# Patient Record
Sex: Male | Born: 1950
Health system: Southern US, Community
[De-identification: ages and names within clinical notes are randomized; demographics above are authoritative.]

## PROBLEM LIST (undated history)

## (undated) DIAGNOSIS — Z8679 Personal history of other diseases of the circulatory system: Secondary | ICD-10-CM

## (undated) DIAGNOSIS — R011 Cardiac murmur, unspecified: Secondary | ICD-10-CM

## (undated) DIAGNOSIS — I2510 Atherosclerotic heart disease of native coronary artery without angina pectoris: Secondary | ICD-10-CM

## (undated) DIAGNOSIS — G4700 Insomnia, unspecified: Secondary | ICD-10-CM

## (undated) DIAGNOSIS — K219 Gastro-esophageal reflux disease without esophagitis: Secondary | ICD-10-CM

## (undated) DIAGNOSIS — E785 Hyperlipidemia, unspecified: Secondary | ICD-10-CM

## (undated) DIAGNOSIS — D649 Anemia, unspecified: Secondary | ICD-10-CM

## (undated) DIAGNOSIS — Z973 Presence of spectacles and contact lenses: Secondary | ICD-10-CM

## (undated) DIAGNOSIS — Z87442 Personal history of urinary calculi: Secondary | ICD-10-CM

## (undated) DIAGNOSIS — C61 Malignant neoplasm of prostate: Secondary | ICD-10-CM

## (undated) DIAGNOSIS — M069 Rheumatoid arthritis, unspecified: Secondary | ICD-10-CM

## (undated) DIAGNOSIS — I10 Essential (primary) hypertension: Secondary | ICD-10-CM

## (undated) DIAGNOSIS — E039 Hypothyroidism, unspecified: Secondary | ICD-10-CM

## (undated) DIAGNOSIS — R399 Unspecified symptoms and signs involving the genitourinary system: Secondary | ICD-10-CM

## (undated) DIAGNOSIS — G7000 Myasthenia gravis without (acute) exacerbation: Secondary | ICD-10-CM

## (undated) DIAGNOSIS — N200 Calculus of kidney: Secondary | ICD-10-CM

## (undated) DIAGNOSIS — E349 Endocrine disorder, unspecified: Secondary | ICD-10-CM

## (undated) DIAGNOSIS — I519 Heart disease, unspecified: Secondary | ICD-10-CM

## (undated) HISTORY — DX: Cardiac murmur, unspecified: R01.1

## (undated) HISTORY — DX: Essential (primary) hypertension: I10

## (undated) HISTORY — DX: Endocrine disorder, unspecified: E34.9

## (undated) HISTORY — DX: Hyperlipidemia, unspecified: E78.5

## (undated) HISTORY — DX: Myasthenia gravis without (acute) exacerbation: G70.00

## (undated) HISTORY — DX: Atherosclerotic heart disease of native coronary artery without angina pectoris: I25.10

## (undated) HISTORY — PX: EXTRACORPOREAL SHOCK WAVE LITHOTRIPSY: SHX1557

## (undated) HISTORY — DX: Calculus of kidney: N20.0

## (undated) HISTORY — PX: TONSILLECTOMY: SUR1361

## (undated) HISTORY — DX: Insomnia, unspecified: G47.00

## (undated) HISTORY — DX: Heart disease, unspecified: I51.9

## (undated) HISTORY — PX: COLONOSCOPY: SHX174

---
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
## 2004-07-29 ENCOUNTER — Inpatient Hospital Stay (HOSPITAL_COMMUNITY): Admission: AD | Admit: 2004-07-29 | Discharge: 2004-08-05 | Payer: Self-pay | Admitting: Cardiology

## 2004-07-29 ENCOUNTER — Ambulatory Visit: Payer: Self-pay | Admitting: Cardiology

## 2004-07-30 ENCOUNTER — Encounter: Payer: Self-pay | Admitting: Cardiology

## 2004-07-30 ENCOUNTER — Ambulatory Visit: Payer: Self-pay | Admitting: Cardiology

## 2004-07-30 HISTORY — PX: CARDIAC CATHETERIZATION: SHX172

## 2004-08-03 HISTORY — PX: CORONARY ANGIOPLASTY: SHX604

## 2004-08-19 ENCOUNTER — Ambulatory Visit: Payer: Self-pay | Admitting: Cardiology

## 2004-08-27 ENCOUNTER — Ambulatory Visit: Payer: Self-pay | Admitting: Cardiology

## 2005-08-02 ENCOUNTER — Ambulatory Visit: Payer: Self-pay | Admitting: Cardiology

## 2005-08-05 ENCOUNTER — Ambulatory Visit: Payer: Self-pay | Admitting: Cardiology

## 2006-04-07 ENCOUNTER — Ambulatory Visit: Payer: Self-pay | Admitting: Cardiology

## 2007-11-07 ENCOUNTER — Ambulatory Visit: Payer: Self-pay | Admitting: Cardiology

## 2007-11-14 ENCOUNTER — Ambulatory Visit: Payer: Self-pay | Admitting: Cardiology

## 2007-12-19 ENCOUNTER — Encounter: Payer: Self-pay | Admitting: Cardiology

## 2007-12-19 ENCOUNTER — Ambulatory Visit: Payer: Self-pay | Admitting: Cardiology

## 2007-12-19 DIAGNOSIS — I10 Essential (primary) hypertension: Secondary | ICD-10-CM | POA: Insufficient documentation

## 2007-12-19 DIAGNOSIS — E785 Hyperlipidemia, unspecified: Secondary | ICD-10-CM | POA: Insufficient documentation

## 2007-12-19 DIAGNOSIS — G7000 Myasthenia gravis without (acute) exacerbation: Secondary | ICD-10-CM | POA: Insufficient documentation

## 2007-12-19 DIAGNOSIS — I2510 Atherosclerotic heart disease of native coronary artery without angina pectoris: Secondary | ICD-10-CM | POA: Insufficient documentation

## 2007-12-19 DIAGNOSIS — G4700 Insomnia, unspecified: Secondary | ICD-10-CM | POA: Insufficient documentation

## 2007-12-20 ENCOUNTER — Encounter: Payer: Self-pay | Admitting: Cardiology

## 2008-04-19 ENCOUNTER — Encounter: Payer: Self-pay | Admitting: Cardiology

## 2008-06-25 ENCOUNTER — Encounter: Payer: Self-pay | Admitting: Cardiology

## 2008-07-05 ENCOUNTER — Encounter: Payer: Self-pay | Admitting: Cardiology

## 2008-07-26 ENCOUNTER — Encounter: Payer: Self-pay | Admitting: Physician Assistant

## 2008-08-16 ENCOUNTER — Ambulatory Visit: Payer: Self-pay | Admitting: Cardiology

## 2008-08-16 ENCOUNTER — Encounter: Payer: Self-pay | Admitting: Cardiology

## 2008-08-16 DIAGNOSIS — IMO0001 Reserved for inherently not codable concepts without codable children: Secondary | ICD-10-CM | POA: Insufficient documentation

## 2008-08-16 DIAGNOSIS — Z87448 Personal history of other diseases of urinary system: Secondary | ICD-10-CM | POA: Insufficient documentation

## 2008-08-16 DIAGNOSIS — E039 Hypothyroidism, unspecified: Secondary | ICD-10-CM | POA: Insufficient documentation

## 2008-09-02 ENCOUNTER — Encounter (INDEPENDENT_AMBULATORY_CARE_PROVIDER_SITE_OTHER): Payer: Self-pay | Admitting: *Deleted

## 2010-06-16 NOTE — Assessment & Plan Note (Signed)
[facility]                          [facility] OFFICE NOTE   NAME:Arthur Hart, Arthur Hart                        MRN:          [id number]  DATE:11/07/2007                            DOB:          05-27-1950    PRIMARY CARE PHYSICIAN:  Dr. Sherryll Burger.   REASON FOR VISIT:  One-year follow-up.   HISTORY OF PRESENT ILLNESS:  Arthur Hart is a 59 year old male patient  with a history of coronary disease status post cutting balloon  angioplasty to the LAD in July 2006 who presents to the office today for  annual follow-up.  Arthur Hart was noted to have significant spasm in the  LAD at the time of his catheterization in 2006.  He actually remained on  intracoronary nitroglycerin prior to his intervention.  His intervention  was done in the LAD at the takeoff of the diagonal where a 70-80% lesion  was demonstrated.  His residual CAD at that time included 30% ostial RCA  lesion which was felt to be suspicious for some spasm.  The diagonal  vessel had a 40-50% narrowing as well.  His EF was well-preserved.  His  last stress test was done in July 2007.  This was negative for ischemia,  and his EF was 60%.  The patient had been placed on nitrates at the time  of intervention and has been maintained on nitrate since that time.  The  patient has noted chest pain off and on since his intervention.   Over the last several months, he thinks that his chest discomfort is  somewhat more prominent.  It is definitely worse with over exertion.  He  ran out of his isosorbide recently.  He decided not to refill it and did  note that the symptoms got worse.  He restarted the medication and noted  that his symptoms did get better.  It should be noted that he does have  erectile dysfunction, and he has taken a phosphodiesterase inhibitor in  the past while he was taking nitrate therapy.  The patient's symptoms  include a left-sided ache.  He does note shortness of breath from time  to time  as well as left arm pain.  He denies nausea or diaphoresis or  syncope.  He denies orthopnea, PND or pedal edema.  He denies any cough.  He did have some recent unexplained fevers treated with several rounds  of antibiotics by his primary care physician.  This seems to have  resolved.   The patient does note a recent history of headaches.  He has never had  headaches like this in the past.  It is unilateral on the left side in  the frontal region.  He does have some blurry vision associated with  this.  He denies photophobia.  He denies associated nausea.  He denies  any symptoms consistent with amaurosis fugax.  He denies any true  scotoma.   CURRENT MEDICATIONS:  Lipitor 10 mg daily, Aspirin 325 mg daily,  Isosorbide 60 mg daily, Synthroid 0.17 mg daily, Temazepam 30 mg every  night, Lisinopril hydrochlorothiazide 10/12.5 mg daily,  Xanax p.r.n., Nitroglycerin p.r.n.   PHYSICAL EXAMINATION:  He is well nourished, well developed in no  distress.  Blood pressure is 127/75, pulse 57, weight 181.6 pounds.  HEENT:  Normal.  NECK:  Without JVD.  LYMPH:  Without lymphadenopathy.  CARDIAC:  Normal S1-S2.  Rate and rhythm without murmur.  LUNGS:  Clear to auscultation bilaterally.  ABDOMEN:  Soft, nontender.  EXTREMITIES:  Without edema.  NEUROLOGIC:  He is alert and oriented x3.  Cranial II-XII grossly  intact.  VASCULAR EXAM:  Without carotid bruits bilaterally.   Electrocardiogram reveals sinus bradycardia with a rate of 55, normal  axis, no acute changes, incomplete right bundle branch block.   ASSESSMENT/PLAN:  1. Chest pain in a 60 year old male patient with a history of coronary      disease status post cutting balloon angioplasty to the LAD in 2006      and residual CAD as outlined above and overall preserved LV      function with a nonischemic Cardiolite study in 2007.  His symptoms      are similar to his previous angina.  He did have significant spasm      on his original  heart catheterization.  He has been maintained on      nitrates since that time.  I am somewhat concerned that he has      taken phosphodiesterase inhibitors while he is on nitrate therapy      in the past.  We had a long discussion today regarding adjustments      in his therapy.  At this point, I have recommended that we      discontinue his isosorbide and replace it with amlodipine at 5 mg a      day.  It has been a few years since his last stress test.  I think      we should proceed with a Cardiolite to rule out progression of      coronary disease.  He will undergo graded exercise treadmill      Cardiolite testing in the next several days.  He will continue on      aspirin.  It should be noted that beta-blockers have not been used      in the past secondary to a history of myasthenia gravis.  2. Headaches.  The patient's headaches sound consistent with migraine      headaches.  Given his visual disturbance, I question whether or not      this could be related to his history of myasthenia gravis.  I think      he should follow up with his primary care physician to further      assess this.  I have asked him to arrange an appointment with Dr.      Sherryll Burger to discuss his headaches further.  Discontinuation of      isosorbide may help alleviate some of his headaches.  3. Dyslipidemia.  He will continue on Lipitor.  Will follow up with      lipids and LFTs.  4. Hypothyroidism.  He will continue follow-up with Dr. Sherryll Burger.  5. History of myasthenia gravis.  He has been stable with this ever      since he had his thymectomy.  Again, as outlined above, I have      asked him follow up Dr. Sherryll Burger.   DISPOSITION:  The patient was also examined by Dr. Andee Lineman today.  He  will follow up with Dr. Andee Lineman in  the next 4-6 weeks to review the above  testing or sooner p.r.n.  I warned the patient today about taking  nitrates with phosphodiesterase inhibitors.      Tereso Newcomer, PA-C  Electronically  Signed      Learta Codding, MD,FACC  Electronically Signed   SW/MedQ  DD: 11/07/2007  DT: 11/07/2007  Job #: [id number]   cc:   Sherryll Burger, M.D.

## 2010-06-19 NOTE — Cardiovascular Report (Signed)
Arthur Hart, Arthur Hart NO.:  [id number]   MEDICAL RECORD NO.:  [id number]          PATIENT TYPE:  INP   LOCATION:  2024                         FACILITY:  [facility]   PHYSICIAN:  Arturo Morton. Riley Kill, M.D. [facility] OF BIRTH:  1950/11/21   DATE OF PROCEDURE:  DATE OF DISCHARGE:                              CARDIAC CATHETERIZATION   INDICATIONS:  Arthur Hart is a very delightful 60 year old who presented  recently with what was felt to be an acute coronary syndrome. He underwent  catheterization which demonstrated some plaquing in the proximal LAD with  some spasm and a hypodense lesion in the mid LAD at the takeoff of the major  diagonal. The diagonal itself had about a 50-60% ostial stenosis. The  diagonal was quite large. The patient also has a history of myasthenia with  prior thymectomy and has hypothyroidism on thyroid replacement therapy. We  took him off the table and got a neurology consult.  They saw him in  consultation and made some specific recommendations that did not require  treatment; however, we felt that we needed to be careful with contrast. We  talked about various options at this point in time and the decision was made  to bring him back to the catheterization laboratory for relook and possible  intravascular ultrasound and potential treatment. We discussed all the  potential approaches and risks and benefits with the patient and his family  and they were agreeable to proceed.   PROCEDURES:  1.  Selective coronary arteriography.  2.  Percutaneous intravascular ultrasound.  3.  Cutting balloon angioplasty of the left anterior descending artery.   DESCRIPTION OF PROCEDURE:  The patient was brought to the catheterization  lab and prepped and draped in usual fashion through an anterior puncture of  the left femoral artery was entered. There was slight oozing at the site of  the sheath insertion. Diagnostic coronary arteriography was performed. I  asked  Dr. Juanda Chance to come to the laboratory and review the films. It was our  consensus that intravascular ultrasound should be performed.  Intravascular  ultrasound revealed a fairly tight lesion with a lesion at the bifurcation.  The distal vessel was approximately 3 x 3 and the lesion site reference  vessel diameter was about 3.5 x 3.5.  The residual lumen was 1.8 x 1.6 with  an area of 2.3. Based on this, we elected to go ahead and treat. The exact  option and best approach was unclear.  We gave the patient Bivalirudin and  an ACT was checked nd found to be in excess of 300 seconds. The wire was  left down the LAD and a second wire was placed across the diagonal. I  elected to go with a 2.75 cutting balloon, 10 mm in length, to assess the  response of the diagonal. Multiple cutting balloon inflations were done at  the site of the lesion with inflation up to approximately 6 atmospheres.  There was substantial improvement in appearance of the LAD  with only mild  compromise of the diagonal. It was felt that if we stented  across this there  would probably be potentially significant compromise and that we would end  up treating this and the diagonal is nearly as big as the LAD. Based upon  the marked improvement in the appearance of the artery, I elected to keep  the patient on the table for additional 10 minutes and take additional  views. With the additional 10 minutes the LAD looked as good as it did with  the original treatment, and I elected to stop at this point in time. The LAD  improved from about 80% down to about 20 to at most 30% and the diagonal  went from approximately 50 to perhaps 60-70 but did not appear to be tight  and there appeared to be good flow down the vessel. Based on this, it was  felt that we should not proceed on and Dr. Samule Ohm and I looked at the films  and were in agreement that further percutaneous intervention should not be  performed based upon the angiographic  result at the time. I then reviewed  the films to family in detail.   All catheters were subsequently removed, femoral sheath sewn into place, and  the patient taken to the holding area in satisfactory clinical condition.   1.  Intravascular ultrasound demonstrated a fairly smooth-appearing distal      left anterior descending artery with about 3.3 x 3  to slightly larger      than this in terms of reference vessel at the site, as previously noted.      There was an approximate 9.5 mm length of disease at the worst site      where the takeoff of the LAD, 1.8 x 1.6 lumen with a luminal area of 2.3      and a  reference area 10.2 yielding a stenosis of 77.4%. Proximal to      this, the vessel opens up quite a bit and there is a fairly prominent      amount of proximal plaquing in the left anterior descending artery as      previously noted. At the worst site this looks about 2.2 x 2.3.  As      noted on previous angiographic study this area appears to open up rather      substantially with intracoronary nitroglycerin.   ANGIOGRAPHIC DATA:  The left anterior descending artery demonstrates about  30-40% narrowing in the proximal vessel. At the takeoff of the second  diagonal there is a focal stenosis in the LAD that measures about 70 to  nearly 80% luminal reduction and is hypodense in multiple views. This  extends over about 10-12 mm area. The diagonal itself has about 40 50%  narrowing. Following treatment, the 80% stenosis reduced to about 20% and  the diagonal was perhaps slightly worse but not significant in terms of flow  limitation. Final angiographic result was felt to be acceptable.   CONCLUSION:  Successful percutaneous cutting balloon angioplasty of the left  anterior descending artery.   DISPOSITION:  The patient be followed closely. Importantly, treatment with  stenting will probably require dual stenting of both the diagonal and LAD with one of a variety of techniques which  could include crush or perhaps V  stenting. Given the limitations of these approaches, it was felt that  initial treatment with a cutting balloon and a good residual lumen on both  branches would be a fairly optimal initial approach, particularly in light  of the patient's myasthenia. We will follow  closely.       TDS/MEDQ  D:  08/03/2004  T:  08/03/2004  Job:  [id number]   cc:   Jonelle Sidle, M.D. [facility]   Kirstie Peri, MD  [address]  [place] [address]  Fax: [phone]

## 2010-06-19 NOTE — Consult Note (Signed)
NAMEBLESSED, COTHAM NO.:  [id number]   MEDICAL RECORD NO.:  [id number]          PATIENT TYPE:  INP   LOCATION:  6532                         FACILITY:  [facility]   PHYSICIAN:  Deanna Artis. Hickling, M.D.DATE OF BIRTH:  Jul 29, 1950   DATE OF CONSULTATION:  07/30/2004  DATE OF DISCHARGE:                                   CONSULTATION   CHIEF COMPLAINT:  Myasthenia gravis.   HISTORY OF PRESENT CONDITION:  Arthur Hart is a 60 year old right-handed  Caucasian gentleman with coronary artery disease who was admitted in  transfer from [facility] for evaluation of four to five episodes of  anginal discomfort over three to four days. The episodes were sudden and  associated with substernal pain radiating to his neck, jaw, and elbows. He  had shortness of breath without diaphoresis. The episodes occurred both at  rest and with exertion. The patient had cardiac catheterization which showed  vasospasm proximally in the left coronary artery, 60% to 70%, which went  down to 30%. There was also some mild narrowing in the 50% range distal to  the left coronary artery and only about 30% in the right. Overall, this was  a fairly unremarkable angiogram.   Nonetheless as part of his evaluation plans were made to perform a 2-D  echocardiogram and to consider further diagnostic workup if that is  abnormal.   PAST MEDICAL HISTORY:  Hypertension, mild dyslipidemia, and myasthenia  gravis. The patient had onset of symptoms for myasthenia when he was 14 or  15 and had diagnosis made and thymectomy at age 55 in 68.  The patient was  on prednisone and Mestinon and did not like the feeling on those  medications. After the thymectomy he was able to be tapered off and has not  gone back on the medications since then despite having some eyelid ptosis,  rare problems with swallowing, rare double vision, but no true actual  weakness. Past medical history is also remarkable for rheumatic  fever as a  child with a chronic heart murmur, osteoarthritis, hypothyroidism, status  post thyroidectomy. I do not know whether he had Graves disease, goiter, or  exactly what his condition was.   PAST SURGICAL HISTORY:  Thymectomy, tonsillectomy, thyroidectomy, history of  nephrolithiasis.   CURRENT MEDICATIONS:  1.  Protonix 40 mg daily.  2.  Synthroid 125 mcg daily, increased to 175 mcg per day.  3.  Xanax 0.5 mg b.i.d.  4.  Lisinopril 5 mg daily.  5.  Enteric-coated aspirin 325 mg daily.   The patient has had other p.r.n. medications including morphine sulfate.   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 The patient does not use tobacco. Though he used alcohol in  the past, just not now.   FAMILY HISTORY:  Both parents had coronary artery disease. The patient has  three brothers who had coronary artery disease.   REVIEW OF SYSTEMS:  The patient has normal appetite and generally sleeps  fairly well. He has some problems with snoring and wife says occasional  apnea. She cannot quantify this. He has not had significant weight gain  recently. HEENT/NECK: No otitis media, pharyngitis, sinusitis. PULMONARY: No  asthma, bronchitis, or pneumonia. CARDIOVASCULAR: See above.  GASTROINTESTINAL: No nausea, vomiting, diarrhea, constipation.  GENITOURINARY: No urinary tract infection or hematuria. MUSCULOSKELETAL: No  fractures, sprains, or deformities. Osteoarthritis is present. ENDOCRINE:  See above. No diabetes. HEMATOLOGIC: No anemia, bruisability, or  lymphadenopathy. ALLERGY/IMMUNOLOGY: No known environmental allergies.  NEUROPSYCHIATRIC: No depression or anxiety.  NEUROLOGIC: No diplopia,  dysarthria, dysphagia, tinnitus, syncope, vertigo, weakness, numbness,  tingling, or loss of bowel and bladder control. Total system review is  otherwise negative.   PHYSICAL EXAMINATION:  GENERAL: On examination today this is a well-   developed, well-nourished right-handed gentleman, balding, in no acute  distress.  VITAL SIGNS: Temperature 98.1, blood pressure 120/75, resting pulse 59,  respirations 16, oxygen saturation 97%.  HEENT: No signs of infection.  LUNGS: Clear to auscultation.  HEART: No murmurs. Pulses normal.  ABDOMEN: Soft,  nontender. Bowel sounds normal. No hepatosplenomegaly.  EXTREMITIES: Well formed.  NEUROLOGIC: The patient is awake, alert, attentive, and appropriate. Cranial  nerves reveal round reactive pupils. Visual fields full. Extraocular  movements full and conjugate. He has some horizontal nystagmus and lateral  gaze which is no consequence. He has bilateral eyelid ptosis about 2-3 mm  above his pupils. This worsens with superior gaze and also worsens with eye  blinking 100 times. He has symmetric facial strength. He does not have a  sneer. He is able to protrude his and elevate his uvula midline. Motor exam  reveals normal strength, tone, and mass. Good fine motor movements. No  pronator drift. On sensation exam, I did not test the right leg vigorously  because of his catheterization. Sensation is intact to cold, vibration, and  stereognosis. Cerebellar examination reveals good finger-to-nose, repetitive  movements. Gait is normal. He is able to walk on his heels and toes,  performing tandem without difficulty. Reflexes are symmetric to diminished.  The patient has bilateral flexor plantar responses.   IMPRESSION:  1.  Myasthenia gravis which seems to have changed from generalized to      ocular.  2.  Obstructive sleep apnea. I do not think this is related to his      myasthenia.  3.  Atherosclerotic cardiovascular disease. This is not related to either of      his nervous system problems.   PLAN:  1.  We will perform a Tensilon test to see whether there is any other      weakness that we can uncover. 2.  For sleep apnea we can perform a polysomnogram. The patient has to be       willing to come for an overnight study. We can determine how often he      has sleep apnea and whether or not it is causing significant      interruption of his sleep.   Other than these two tests (Tensilon and polysomnogram) I see no further  workup necessary. I have given the patient my card and will follow up yearly  with him.   I appreciate the opportunity to participate in his care.       [facility]/MEDQ  D:  07/30/2004  T:  07/30/2004  Job:  [id number]   cc:   Arturo Morton. Riley Kill, M.D. [facility]

## 2010-06-19 NOTE — Op Note (Signed)
[name], Arthur Hart NO.:  [id number]   MEDICAL RECORD NO.:  [id number]          PATIENT TYPE:  INP   LOCATION:  6532                         FACILITY:  [facility]   PHYSICIAN:  Deanna Artis. Hickling, M.D.DATE OF BIRTH:  03/12/1950   DATE OF PROCEDURE:  07/30/2004  DATE OF DISCHARGE:                                 OPERATIVE REPORT   PROCEDURE:  Tensilon test.   INDICATIONS:  Myasthenia gravis with eyelid ptosis.   PROCEDURE:  Tensilon 1 mg was placed intravenously and flushed in the  patient's catheter.  This did not produce any systemic effects and no change  in his eyelids.  Then 9 mg was flushed and produced improvement in his  eyelid ptosis from 2-3 mm above the pupil at rest to 4-5 mm nearly bordering  on resting near the level of the superior margin of the iris.  The patient  had some tearing, some eyelid fasciculations, and some queasiness.  No  significant lowering of heart rate or blood pressure.   IMPRESSION:  Mildly positive Tensilon test, consistent with ocular  myasthenia.  Patient tolerated the procedure well.  His symptoms resolved  within minutes.       [facility]/MEDQ  D:  07/30/2004  T:  07/30/2004  Job:  [id number]

## 2010-06-19 NOTE — Assessment & Plan Note (Signed)
[facility]                          [facility] OFFICE NOTE   NAME:Arthur Hart, Arthur Hart                        MRN:          [id number]  DATE:04/07/2006                            DOB:          July 09, 1950    REASON FOR PRESENTATION:  The patient is a 60 year old, history of  coronary artery disease status post PCI with cutting balloon to the LAD  in July of 2006.  The patient also has 60% ostial diagonal stenosis, but  otherwise nonobstructive disease.  He has normal left ventricle  function.   The patient states he has been doing well.  He was seen last June in the  office, at which time he had still some chest pain.  He had a stress  test done which showed no ischemia.  He states now that he has no  recurrence of sternal chest pain, no shortness of breath, orthopnea,  PND.  He states he feels well.   CURRENT MEDICATIONS:  1. Plavix 75 mg a day.  2. Lipitor 10 mg p.o. q.h.s.  3. Aspirin 325 daily.  4. Isosorbide 60 mg p.o. daily.  5. Lisinopril 5 mg p.o. daily.  6. Synthroid 175 mcg p.o. daily.  7. Temazepam q.h.s.   PHYSICAL EXAMINATION:  VITAL SIGNS:  Blood pressure 168/92, heart rate  is 61 BPM.  GENERAL:  Well-nourished white male in no apparent distress.  HEENT:  Pupils are normal, conjunctiva pink.  NECK:  Supple, noJVD , no current bruits.  LUNGS:  Clear breath sounds bilaterally.  HEART:  Regular rate and rhythm, normal S1, S2, no murmurs, rubs or  gallops.  ABDOMEN:  Soft.  EXTREMITIES:  No cyanosis, clubbing or edema.   PROBLEM LIST:  1. Coronary artery disease status post PCI to the LAD.  2. Normal left ventricular function.  3. Hypertension, poorly controlled.  4. Hypothyroidism, status post thyroidectomy.  5. Dysphagia.  6. Dyslipidemia.   PLAN:  1. The patient is doing well from a cardiovascular perspective.  2. The patient has under-controlled hypertension.  We changed his      lisinopril to lisinopril 10      mg p.o.  daily/hydrochlorothiazide 12.5 mg p.o. daily.  3. The patient will follow up with [place] in one year.     Learta Codding, MD,FACC  Electronically Signed    GED/MedQ  DD: 04/07/2006  DT: 04/07/2006  Job #: [id number]

## 2010-06-19 NOTE — Cardiovascular Report (Signed)
NAMEDENNISE, BAMBER NO.:  [id number]   MEDICAL RECORD NO.:  [id number]          PATIENT TYPE:  INP   LOCATION:  6532                         FACILITY:  [facility]   PHYSICIAN:  Arturo Morton. Riley Kill, M.D. [facility] OF BIRTH:  1951/01/01   DATE OF PROCEDURE:  07/30/2004  DATE OF DISCHARGE:                              CARDIAC CATHETERIZATION   INDICATIONS:  Mr. Rueda is a 60 year old who presents with some recurrent  episodes of chest pain.  He has not had demonstrated EKG changes.  He has a  history of myasthenia gravis.  In addition to his myasthenia, the patient  has had hypothyroidism.  He has had prior thymectomy.  The current study was  done to assess coronary anatomy.   PROCEDURE:  1.  Left heart catheterization.  2.  Selective coronary arteriography.  3.  Selective left ventriculography.   DESCRIPTION OF THE PROCEDURE:  The patient was brought to the cath lab and  prepped and draped in the usual fashion.  Through an anterior puncture, the  right femoral artery was easily entered.  A 6-French sheath was placed.  Views of the left and right coronary arteries were obtained in multiple  angiographic projections.  Importantly, the patient had evidence of what  appeared to be coronary spasm in the proximal left anterior descending  artery.  This was dramatically improved with intracoronary nitroglycerin.  The patient also appears to have some bifurcational disease of the LAD  diagonal, but it was somewhat hard to assess in this setting.  Central  aortic and left ventricular pressures were done with a pigtail.  Ventriculography was performed in the RAO projection.  The cines were then  reviewed.  An acute MI was called for the emergency room.  We discussed the  various possibilities.  The patient was taken off and taken to the holding  area in satisfactory clinical condition.   HEMODYNAMIC DATA.:  1.  Central aortic pressure 150/83, mean 109.  2.  Left atrial  pressure 143/16.  3.  No gradient on pullback across aortic valve.   ANGIOGRAPHIC DATA:  1.  Ventriculography was formed in the RAO projection.  Overall systolic      function was well-preserved.  No segmental abnormalities or contraction      were identified.  2.  The right coronary artery demonstrates some ostial narrowing which could      represent some spasm.  There is a large PDA and posterolateral system,      all of which appear to be free of significant disease.  The ostium is      narrowed no more than about 30%, however, and there was not substantial      damping in the vessel itself.  3.  The circumflex and LAD appear to have separate ostia.  The LAD itself      has about 50% to 60% narrowing proximally, or perhaps even up to 70%,      which is subsequently relieved by intracoronary nitroglycerin.  There is      what appears to be bifurcational disease involving the  LAD and diagonal      bifurcation.  The severity is a little bit hard to gauge; I would gauge      the severity as probably about 60% to 70% and there is some hypodensity      involve there.  Following intracoronary nitroglycerin, there is dramatic      increase in the size of the proximal LAD.  The LAD diagonal bifurcation      demonstrates probably 60% to 70% narrowing right at that location as      well as about 50% narrowing at the origin of the diagonal, which      represents the second diagonal.  The more proximal site, which appeared      to be more severe prior to the nitroglycerin, has what appears to be      about 30% narrowing after the intracoronary nitroglycerin.   The circumflex consists of a large first marginal branch, then a moderate-  sized second marginal branch as well; neither of these appear to be with  critical narrowing.   CONCLUSION:  1.  Preserved left ventricular function.  2.  Evidence of significant proximal left anterior descending spasm of      uncertain etiology.  3.  Moderately  severe mid left anterior descending disease.   DISPOSITION:  1.  We plan to place the patient on intracoronary nitroglycerin.  2.  He will be loaded with oral clopidogrel.  3.  We will check his thyroid function, since this has not been checked in      some time.  4.  A neuro consult will be obtained to assess his myasthenia.  5.  We will plan potentially intravascular ultrasound, but I would get him      well-loaded with nitrates prior to doing this to try to prevent spasm in      the proximal vessel.       TDS/MEDQ  D:  07/30/2004  T:  07/30/2004  Job:  [id number]   cc:   Learta Codding, M.D. [facility]  [address]  [address]  [place]  [place] [address]   Kirstie Peri, MD  [address]  [place] [address]  Fax: [phone]   [facility]

## 2010-06-19 NOTE — Discharge Summary (Signed)
[name], [name] NO.:  [id number]   MEDICAL RECORD NO.:  [id number]          PATIENT TYPE:  INP   LOCATION:  3712                         FACILITY:  [facility]   PHYSICIAN:  Arturo Morton. Riley Kill, M.D. [facility] OF BIRTH:  1950/05/13   DATE OF ADMISSION:  07/29/2004  DATE OF DISCHARGE:  08/05/2004                                 DISCHARGE SUMMARY   PROCEDURES:  1.  Cardiac catheterization.  2.  Coronary arteriogram.  3.  Left ventriculogram.  4.  Percutaneous intervention with cutting balloon and intravascular      ultrasound as well as angioplasty to one vessel.   DISCHARGE DIAGNOSES:  1.  Unstable anginal pain, status post cutting balloon angioplasty to the      left anterior descending, spasm noted to be left main, no other      significant disease.  2.  Preserved left ventricular function with a normal ejection fraction at      catheterization.  3.  Hypertension.  4.  Hyperlipidemia.  5.  Family history of coronary artery disease.  6.  Myasthenia gravis.  7.  Hypothyroidism.  8.  History of rheumatic fever and a subsequent heart murmur.  9.  History of pericarditis in the 1980s.  10. Status post thymectomy, tonsillectomy and lithotripsy.  11. Remote history of EtOH use.  12. History of chronic diarrhea with history of a negative evaluation.  13. History of lower back pain.   HOSPITAL COURSE:  [name] is a 60 year old gentleman with no known  history of coronary artery disease.  He had chest discomfort and went to  [facility] where he was admitted.  Cardiology consult was performed  and it was felt that he needed cardiac catheterization.  He was transferred  to [facility] for further evaluation and treatment.   Cardiac catheterization showed left main spasm going up to 60-70%.  He had a  50-60% stenosis with haziness in the LAD and 50% in the first diagonal as  well as a 30% stenosis in the RCA and his EF was normal.  Dr. Riley Kill  started him on nitrates and scheduled an intervention.   He had IVUS of the LAD and cutting balloon percutaneous intervention.  The  diagonal stenosis did not significantly change.  He was kept in the lab and  a re-look was done which showed continued patency of the vessel.  He  tolerated the procedure well.   [name] had been started on Imdur and continued on lisinopril which he had  been taking prior to admission in decreased dose.  His systolic blood  pressure dropped in the 80s and he was symptomatic with this.  The  lisinopril was discontinued and he is to continue on the Imdur and follow up  as an outpatient.  Lisinopril will probably be restarted at that time.   Because of his myasthenia gravis, a neuro consult was called.   Dr. Sharene Skeans felt that his myasthenia gravis had changed from generalized to  ocular.  He also felt that he had obstructive sleep apnea but that this was  not related to his myasthenia.  Dr. Sharene Skeans recommended a polysomnogram for  evaluation of the sleep apnea and performed a Tensilon test to assess him.  He felt that the test was mildly positive and this was consistent with  ocular myasthenia.  No further workup is indicated.  He is to follow up as  an outpatient as scheduled.   On August 05, 2004, [name] was ambulating without chest pain or shortness of  breath.  He was considered stable for discharge with outpatient followup  arranged.   DISCHARGE INSTRUCTIONS:  His activity level is to be restricted for 2 weeks  and he is return to work on July 17.  He is to stick to a low-fat diet.  He  is to call our office for problems with the cath site.  He is to follow up  with Arnette Felts for Dr. Gala Romney on July 19 at 10:15.  He is to follow up  with his primary care physician, Dr. Sherryll Burger, as needed or as scheduled.   DISCHARGE MEDICATIONS:  1.  Coated aspirin 325 mg daily.  2.  Plavix 75 mg daily.  3.  Lisinopril is on hold.  4.  Synthroid 0.175 mg  daily.  5.  Imdur 30 mg daily.  6.  Nitroglycerin sublingual p.r.n.  7.  Xanax as prior to admission.       RB/MEDQ  D:  08/05/2004  T:  08/05/2004  Job:  [id number]   cc:   [facility]  [place], [place]   Dr. Sherryll Burger

## 2016-12-15 DIAGNOSIS — M75120 Complete rotator cuff tear or rupture of unspecified shoulder, not specified as traumatic: Secondary | ICD-10-CM | POA: Insufficient documentation

## 2017-10-19 DIAGNOSIS — Z6827 Body mass index (BMI) 27.0-27.9, adult: Secondary | ICD-10-CM | POA: Diagnosis not present

## 2017-10-19 DIAGNOSIS — Z0000 Encounter for general adult medical examination without abnormal findings: Secondary | ICD-10-CM | POA: Diagnosis not present

## 2017-10-19 DIAGNOSIS — Z79899 Other long term (current) drug therapy: Secondary | ICD-10-CM | POA: Diagnosis not present

## 2017-10-19 DIAGNOSIS — Z1331 Encounter for screening for depression: Secondary | ICD-10-CM | POA: Diagnosis not present

## 2017-10-19 DIAGNOSIS — E039 Hypothyroidism, unspecified: Secondary | ICD-10-CM | POA: Diagnosis not present

## 2017-10-19 DIAGNOSIS — Z299 Encounter for prophylactic measures, unspecified: Secondary | ICD-10-CM | POA: Diagnosis not present

## 2017-10-19 DIAGNOSIS — Z1211 Encounter for screening for malignant neoplasm of colon: Secondary | ICD-10-CM | POA: Diagnosis not present

## 2017-10-19 DIAGNOSIS — H524 Presbyopia: Secondary | ICD-10-CM | POA: Diagnosis not present

## 2017-10-19 DIAGNOSIS — E7800 Pure hypercholesterolemia, unspecified: Secondary | ICD-10-CM | POA: Diagnosis not present

## 2017-10-19 DIAGNOSIS — Z7189 Other specified counseling: Secondary | ICD-10-CM | POA: Diagnosis not present

## 2017-10-19 DIAGNOSIS — Z125 Encounter for screening for malignant neoplasm of prostate: Secondary | ICD-10-CM | POA: Diagnosis not present

## 2017-10-19 DIAGNOSIS — I10 Essential (primary) hypertension: Secondary | ICD-10-CM | POA: Diagnosis not present

## 2017-10-19 DIAGNOSIS — Z1339 Encounter for screening examination for other mental health and behavioral disorders: Secondary | ICD-10-CM | POA: Diagnosis not present

## 2017-11-18 DIAGNOSIS — H524 Presbyopia: Secondary | ICD-10-CM | POA: Diagnosis not present

## 2017-11-18 DIAGNOSIS — R69 Illness, unspecified: Secondary | ICD-10-CM | POA: Diagnosis not present

## 2017-12-05 DIAGNOSIS — R9431 Abnormal electrocardiogram [ECG] [EKG]: Secondary | ICD-10-CM | POA: Diagnosis not present

## 2017-12-17 DIAGNOSIS — H5789 Other specified disorders of eye and adnexa: Secondary | ICD-10-CM | POA: Diagnosis not present

## 2017-12-19 DIAGNOSIS — H16041 Marginal corneal ulcer, right eye: Secondary | ICD-10-CM | POA: Diagnosis not present

## 2018-01-18 DIAGNOSIS — Z6827 Body mass index (BMI) 27.0-27.9, adult: Secondary | ICD-10-CM | POA: Diagnosis not present

## 2018-01-18 DIAGNOSIS — E039 Hypothyroidism, unspecified: Secondary | ICD-10-CM | POA: Diagnosis not present

## 2018-01-18 DIAGNOSIS — Z299 Encounter for prophylactic measures, unspecified: Secondary | ICD-10-CM | POA: Diagnosis not present

## 2018-01-18 DIAGNOSIS — I10 Essential (primary) hypertension: Secondary | ICD-10-CM | POA: Diagnosis not present

## 2018-01-18 DIAGNOSIS — G4700 Insomnia, unspecified: Secondary | ICD-10-CM | POA: Diagnosis not present

## 2018-03-15 DIAGNOSIS — E039 Hypothyroidism, unspecified: Secondary | ICD-10-CM | POA: Diagnosis not present

## 2018-04-17 DIAGNOSIS — I10 Essential (primary) hypertension: Secondary | ICD-10-CM | POA: Diagnosis not present

## 2018-04-17 DIAGNOSIS — K219 Gastro-esophageal reflux disease without esophagitis: Secondary | ICD-10-CM | POA: Diagnosis not present

## 2018-04-17 DIAGNOSIS — Z789 Other specified health status: Secondary | ICD-10-CM | POA: Diagnosis not present

## 2018-04-17 DIAGNOSIS — Z6829 Body mass index (BMI) 29.0-29.9, adult: Secondary | ICD-10-CM | POA: Diagnosis not present

## 2018-04-17 DIAGNOSIS — Z299 Encounter for prophylactic measures, unspecified: Secondary | ICD-10-CM | POA: Diagnosis not present

## 2018-04-17 DIAGNOSIS — E7800 Pure hypercholesterolemia, unspecified: Secondary | ICD-10-CM | POA: Diagnosis not present

## 2018-06-21 DIAGNOSIS — Z713 Dietary counseling and surveillance: Secondary | ICD-10-CM | POA: Diagnosis not present

## 2018-06-21 DIAGNOSIS — Z299 Encounter for prophylactic measures, unspecified: Secondary | ICD-10-CM | POA: Diagnosis not present

## 2018-06-21 DIAGNOSIS — I10 Essential (primary) hypertension: Secondary | ICD-10-CM | POA: Diagnosis not present

## 2018-06-21 DIAGNOSIS — Z6829 Body mass index (BMI) 29.0-29.9, adult: Secondary | ICD-10-CM | POA: Diagnosis not present

## 2018-08-23 DIAGNOSIS — E039 Hypothyroidism, unspecified: Secondary | ICD-10-CM | POA: Diagnosis not present

## 2018-08-23 DIAGNOSIS — Z299 Encounter for prophylactic measures, unspecified: Secondary | ICD-10-CM | POA: Diagnosis not present

## 2018-08-23 DIAGNOSIS — Z6828 Body mass index (BMI) 28.0-28.9, adult: Secondary | ICD-10-CM | POA: Diagnosis not present

## 2018-08-23 DIAGNOSIS — I10 Essential (primary) hypertension: Secondary | ICD-10-CM | POA: Diagnosis not present

## 2018-08-23 DIAGNOSIS — E7800 Pure hypercholesterolemia, unspecified: Secondary | ICD-10-CM | POA: Diagnosis not present

## 2018-11-01 DIAGNOSIS — Z1339 Encounter for screening examination for other mental health and behavioral disorders: Secondary | ICD-10-CM | POA: Diagnosis not present

## 2018-11-01 DIAGNOSIS — Z6828 Body mass index (BMI) 28.0-28.9, adult: Secondary | ICD-10-CM | POA: Diagnosis not present

## 2018-11-01 DIAGNOSIS — Z1331 Encounter for screening for depression: Secondary | ICD-10-CM | POA: Diagnosis not present

## 2018-11-01 DIAGNOSIS — E039 Hypothyroidism, unspecified: Secondary | ICD-10-CM | POA: Diagnosis not present

## 2018-11-01 DIAGNOSIS — Z79899 Other long term (current) drug therapy: Secondary | ICD-10-CM | POA: Diagnosis not present

## 2018-11-01 DIAGNOSIS — Z125 Encounter for screening for malignant neoplasm of prostate: Secondary | ICD-10-CM | POA: Diagnosis not present

## 2018-11-01 DIAGNOSIS — Z1211 Encounter for screening for malignant neoplasm of colon: Secondary | ICD-10-CM | POA: Diagnosis not present

## 2018-11-01 DIAGNOSIS — I10 Essential (primary) hypertension: Secondary | ICD-10-CM | POA: Diagnosis not present

## 2018-11-01 DIAGNOSIS — Z23 Encounter for immunization: Secondary | ICD-10-CM | POA: Diagnosis not present

## 2018-11-01 DIAGNOSIS — E7800 Pure hypercholesterolemia, unspecified: Secondary | ICD-10-CM | POA: Diagnosis not present

## 2018-11-01 DIAGNOSIS — Z7189 Other specified counseling: Secondary | ICD-10-CM | POA: Diagnosis not present

## 2018-11-01 DIAGNOSIS — Z0000 Encounter for general adult medical examination without abnormal findings: Secondary | ICD-10-CM | POA: Diagnosis not present

## 2019-01-01 DIAGNOSIS — E039 Hypothyroidism, unspecified: Secondary | ICD-10-CM | POA: Diagnosis not present

## 2019-01-01 DIAGNOSIS — R69 Illness, unspecified: Secondary | ICD-10-CM | POA: Diagnosis not present

## 2019-01-01 DIAGNOSIS — Z6828 Body mass index (BMI) 28.0-28.9, adult: Secondary | ICD-10-CM | POA: Diagnosis not present

## 2019-01-01 DIAGNOSIS — I10 Essential (primary) hypertension: Secondary | ICD-10-CM | POA: Diagnosis not present

## 2019-01-01 DIAGNOSIS — Z299 Encounter for prophylactic measures, unspecified: Secondary | ICD-10-CM | POA: Diagnosis not present

## 2019-01-02 DIAGNOSIS — Z8616 Personal history of COVID-19: Secondary | ICD-10-CM

## 2019-01-02 HISTORY — DX: Personal history of COVID-19: Z86.16

## 2019-01-08 DIAGNOSIS — Z1211 Encounter for screening for malignant neoplasm of colon: Secondary | ICD-10-CM | POA: Insufficient documentation

## 2019-01-23 DIAGNOSIS — U071 COVID-19: Secondary | ICD-10-CM | POA: Diagnosis not present

## 2019-01-23 DIAGNOSIS — I10 Essential (primary) hypertension: Secondary | ICD-10-CM | POA: Diagnosis not present

## 2019-01-23 DIAGNOSIS — Z79899 Other long term (current) drug therapy: Secondary | ICD-10-CM | POA: Diagnosis not present

## 2019-05-25 DIAGNOSIS — E039 Hypothyroidism, unspecified: Secondary | ICD-10-CM | POA: Diagnosis not present

## 2019-05-25 DIAGNOSIS — B079 Viral wart, unspecified: Secondary | ICD-10-CM | POA: Diagnosis not present

## 2019-05-25 DIAGNOSIS — I10 Essential (primary) hypertension: Secondary | ICD-10-CM | POA: Diagnosis not present

## 2019-05-25 DIAGNOSIS — Z6828 Body mass index (BMI) 28.0-28.9, adult: Secondary | ICD-10-CM | POA: Diagnosis not present

## 2019-05-25 DIAGNOSIS — L989 Disorder of the skin and subcutaneous tissue, unspecified: Secondary | ICD-10-CM | POA: Diagnosis not present

## 2019-05-25 DIAGNOSIS — Z299 Encounter for prophylactic measures, unspecified: Secondary | ICD-10-CM | POA: Diagnosis not present

## 2019-05-25 DIAGNOSIS — Z789 Other specified health status: Secondary | ICD-10-CM | POA: Diagnosis not present

## 2019-05-25 DIAGNOSIS — R69 Illness, unspecified: Secondary | ICD-10-CM | POA: Diagnosis not present

## 2019-06-15 DIAGNOSIS — M4802 Spinal stenosis, cervical region: Secondary | ICD-10-CM | POA: Diagnosis not present

## 2019-06-15 DIAGNOSIS — M25512 Pain in left shoulder: Secondary | ICD-10-CM | POA: Diagnosis not present

## 2019-07-09 DIAGNOSIS — M4802 Spinal stenosis, cervical region: Secondary | ICD-10-CM | POA: Diagnosis not present

## 2019-07-09 DIAGNOSIS — M50223 Other cervical disc displacement at C6-C7 level: Secondary | ICD-10-CM | POA: Diagnosis not present

## 2019-07-09 DIAGNOSIS — M2578 Osteophyte, vertebrae: Secondary | ICD-10-CM | POA: Diagnosis not present

## 2019-07-09 DIAGNOSIS — M19012 Primary osteoarthritis, left shoulder: Secondary | ICD-10-CM | POA: Diagnosis not present

## 2019-07-09 DIAGNOSIS — Z01818 Encounter for other preprocedural examination: Secondary | ICD-10-CM | POA: Diagnosis not present

## 2019-07-09 DIAGNOSIS — S46012A Strain of muscle(s) and tendon(s) of the rotator cuff of left shoulder, initial encounter: Secondary | ICD-10-CM | POA: Diagnosis not present

## 2019-07-13 DIAGNOSIS — M4802 Spinal stenosis, cervical region: Secondary | ICD-10-CM | POA: Diagnosis not present

## 2019-07-13 DIAGNOSIS — M25512 Pain in left shoulder: Secondary | ICD-10-CM | POA: Diagnosis not present

## 2019-07-24 DIAGNOSIS — S233XXA Sprain of ligaments of thoracic spine, initial encounter: Secondary | ICD-10-CM | POA: Diagnosis not present

## 2019-07-24 DIAGNOSIS — M47812 Spondylosis without myelopathy or radiculopathy, cervical region: Secondary | ICD-10-CM | POA: Diagnosis not present

## 2019-07-24 DIAGNOSIS — M9901 Segmental and somatic dysfunction of cervical region: Secondary | ICD-10-CM | POA: Diagnosis not present

## 2019-07-26 DIAGNOSIS — S233XXA Sprain of ligaments of thoracic spine, initial encounter: Secondary | ICD-10-CM | POA: Diagnosis not present

## 2019-07-26 DIAGNOSIS — M9901 Segmental and somatic dysfunction of cervical region: Secondary | ICD-10-CM | POA: Diagnosis not present

## 2019-07-26 DIAGNOSIS — M47812 Spondylosis without myelopathy or radiculopathy, cervical region: Secondary | ICD-10-CM | POA: Diagnosis not present

## 2019-07-31 DIAGNOSIS — S233XXA Sprain of ligaments of thoracic spine, initial encounter: Secondary | ICD-10-CM | POA: Diagnosis not present

## 2019-07-31 DIAGNOSIS — M47812 Spondylosis without myelopathy or radiculopathy, cervical region: Secondary | ICD-10-CM | POA: Diagnosis not present

## 2019-07-31 DIAGNOSIS — M9901 Segmental and somatic dysfunction of cervical region: Secondary | ICD-10-CM | POA: Diagnosis not present

## 2019-08-02 DIAGNOSIS — M9901 Segmental and somatic dysfunction of cervical region: Secondary | ICD-10-CM | POA: Diagnosis not present

## 2019-08-02 DIAGNOSIS — M47812 Spondylosis without myelopathy or radiculopathy, cervical region: Secondary | ICD-10-CM | POA: Diagnosis not present

## 2019-08-02 DIAGNOSIS — S233XXA Sprain of ligaments of thoracic spine, initial encounter: Secondary | ICD-10-CM | POA: Diagnosis not present

## 2019-08-07 DIAGNOSIS — M47812 Spondylosis without myelopathy or radiculopathy, cervical region: Secondary | ICD-10-CM | POA: Diagnosis not present

## 2019-08-07 DIAGNOSIS — M9901 Segmental and somatic dysfunction of cervical region: Secondary | ICD-10-CM | POA: Diagnosis not present

## 2019-08-07 DIAGNOSIS — S233XXA Sprain of ligaments of thoracic spine, initial encounter: Secondary | ICD-10-CM | POA: Diagnosis not present

## 2019-08-09 DIAGNOSIS — M47812 Spondylosis without myelopathy or radiculopathy, cervical region: Secondary | ICD-10-CM | POA: Diagnosis not present

## 2019-08-09 DIAGNOSIS — M9901 Segmental and somatic dysfunction of cervical region: Secondary | ICD-10-CM | POA: Diagnosis not present

## 2019-08-09 DIAGNOSIS — S233XXA Sprain of ligaments of thoracic spine, initial encounter: Secondary | ICD-10-CM | POA: Diagnosis not present

## 2019-08-16 DIAGNOSIS — M47812 Spondylosis without myelopathy or radiculopathy, cervical region: Secondary | ICD-10-CM | POA: Diagnosis not present

## 2019-08-16 DIAGNOSIS — R030 Elevated blood-pressure reading, without diagnosis of hypertension: Secondary | ICD-10-CM | POA: Insufficient documentation

## 2019-08-16 DIAGNOSIS — Z6830 Body mass index (BMI) 30.0-30.9, adult: Secondary | ICD-10-CM | POA: Insufficient documentation

## 2019-08-17 DIAGNOSIS — I2510 Atherosclerotic heart disease of native coronary artery without angina pectoris: Secondary | ICD-10-CM | POA: Diagnosis not present

## 2019-08-17 DIAGNOSIS — M79605 Pain in left leg: Secondary | ICD-10-CM | POA: Diagnosis not present

## 2019-08-17 DIAGNOSIS — M79604 Pain in right leg: Secondary | ICD-10-CM | POA: Diagnosis not present

## 2019-08-17 DIAGNOSIS — E039 Hypothyroidism, unspecified: Secondary | ICD-10-CM | POA: Diagnosis not present

## 2019-08-17 DIAGNOSIS — I10 Essential (primary) hypertension: Secondary | ICD-10-CM | POA: Diagnosis not present

## 2019-08-17 DIAGNOSIS — Z299 Encounter for prophylactic measures, unspecified: Secondary | ICD-10-CM | POA: Diagnosis not present

## 2019-08-22 ENCOUNTER — Other Ambulatory Visit: Payer: Self-pay | Admitting: Neurological Surgery

## 2019-08-22 DIAGNOSIS — M47812 Spondylosis without myelopathy or radiculopathy, cervical region: Secondary | ICD-10-CM

## 2019-08-24 ENCOUNTER — Inpatient Hospital Stay: Admission: RE | Admit: 2019-08-24 | Payer: Self-pay | Source: Ambulatory Visit

## 2019-08-24 ENCOUNTER — Other Ambulatory Visit: Payer: Self-pay

## 2019-08-28 DIAGNOSIS — H524 Presbyopia: Secondary | ICD-10-CM | POA: Diagnosis not present

## 2019-08-30 ENCOUNTER — Ambulatory Visit
Admission: RE | Admit: 2019-08-30 | Discharge: 2019-08-30 | Disposition: A | Discharge status: Home / Self Care | Payer: Medicare HMO | Source: Ambulatory Visit | Attending: Neurological Surgery | Admitting: Neurological Surgery

## 2019-08-30 ENCOUNTER — Other Ambulatory Visit: Payer: Self-pay

## 2019-08-30 DIAGNOSIS — M47812 Spondylosis without myelopathy or radiculopathy, cervical region: Secondary | ICD-10-CM

## 2019-08-30 DIAGNOSIS — R69 Illness, unspecified: Secondary | ICD-10-CM | POA: Diagnosis not present

## 2019-08-30 DIAGNOSIS — M542 Cervicalgia: Secondary | ICD-10-CM | POA: Diagnosis not present

## 2019-08-30 MED ORDER — DEXAMETHASONE SODIUM PHOSPHATE 4 MG/ML IJ SOLN
10.0000 mg | Freq: Once | INTRAMUSCULAR | Status: AC
  Administered 2019-08-30: 10 mg via INTRA_ARTICULAR

## 2019-08-30 MED ORDER — IOPAMIDOL (ISOVUE-M 300) INJECTION 61%
1.0000 mL | Freq: Once | INTRAMUSCULAR | Status: AC
  Administered 2019-08-30: 1 mL via INTRA_ARTICULAR

## 2019-08-30 NOTE — Discharge Instructions (Signed)

## 2019-09-10 DIAGNOSIS — Z299 Encounter for prophylactic measures, unspecified: Secondary | ICD-10-CM | POA: Diagnosis not present

## 2019-09-10 DIAGNOSIS — M2550 Pain in unspecified joint: Secondary | ICD-10-CM | POA: Diagnosis not present

## 2019-09-10 DIAGNOSIS — I10 Essential (primary) hypertension: Secondary | ICD-10-CM | POA: Diagnosis not present

## 2019-09-10 DIAGNOSIS — Z789 Other specified health status: Secondary | ICD-10-CM | POA: Diagnosis not present

## 2019-09-10 DIAGNOSIS — W57XXXA Bitten or stung by nonvenomous insect and other nonvenomous arthropods, initial encounter: Secondary | ICD-10-CM | POA: Diagnosis not present

## 2019-09-11 DIAGNOSIS — I10 Essential (primary) hypertension: Secondary | ICD-10-CM | POA: Diagnosis not present

## 2019-09-11 DIAGNOSIS — Z299 Encounter for prophylactic measures, unspecified: Secondary | ICD-10-CM | POA: Diagnosis not present

## 2019-09-11 DIAGNOSIS — Z789 Other specified health status: Secondary | ICD-10-CM | POA: Diagnosis not present

## 2019-09-11 DIAGNOSIS — M2550 Pain in unspecified joint: Secondary | ICD-10-CM | POA: Diagnosis not present

## 2019-09-24 DIAGNOSIS — I10 Essential (primary) hypertension: Secondary | ICD-10-CM | POA: Diagnosis not present

## 2019-09-24 DIAGNOSIS — Z299 Encounter for prophylactic measures, unspecified: Secondary | ICD-10-CM | POA: Diagnosis not present

## 2019-09-24 DIAGNOSIS — Z789 Other specified health status: Secondary | ICD-10-CM | POA: Diagnosis not present

## 2019-09-24 DIAGNOSIS — R5383 Other fatigue: Secondary | ICD-10-CM | POA: Diagnosis not present

## 2019-09-24 DIAGNOSIS — A770 Spotted fever due to Rickettsia rickettsii: Secondary | ICD-10-CM | POA: Diagnosis not present

## 2019-09-24 DIAGNOSIS — E039 Hypothyroidism, unspecified: Secondary | ICD-10-CM | POA: Diagnosis not present

## 2019-09-24 DIAGNOSIS — M2550 Pain in unspecified joint: Secondary | ICD-10-CM | POA: Diagnosis not present

## 2019-09-27 DIAGNOSIS — R69 Illness, unspecified: Secondary | ICD-10-CM | POA: Diagnosis not present

## 2019-09-27 DIAGNOSIS — I10 Essential (primary) hypertension: Secondary | ICD-10-CM | POA: Diagnosis not present

## 2019-09-27 DIAGNOSIS — A770 Spotted fever due to Rickettsia rickettsii: Secondary | ICD-10-CM | POA: Diagnosis not present

## 2019-09-27 DIAGNOSIS — Z299 Encounter for prophylactic measures, unspecified: Secondary | ICD-10-CM | POA: Diagnosis not present

## 2019-09-27 DIAGNOSIS — M160 Bilateral primary osteoarthritis of hip: Secondary | ICD-10-CM | POA: Diagnosis not present

## 2019-09-27 DIAGNOSIS — R001 Bradycardia, unspecified: Secondary | ICD-10-CM | POA: Diagnosis not present

## 2019-10-09 DIAGNOSIS — M542 Cervicalgia: Secondary | ICD-10-CM | POA: Diagnosis not present

## 2019-10-09 DIAGNOSIS — E559 Vitamin D deficiency, unspecified: Secondary | ICD-10-CM | POA: Diagnosis not present

## 2019-10-09 DIAGNOSIS — Z79899 Other long term (current) drug therapy: Secondary | ICD-10-CM | POA: Diagnosis not present

## 2019-10-09 DIAGNOSIS — M79642 Pain in left hand: Secondary | ICD-10-CM | POA: Diagnosis not present

## 2019-10-09 DIAGNOSIS — R5383 Other fatigue: Secondary | ICD-10-CM | POA: Diagnosis not present

## 2019-10-09 DIAGNOSIS — M25512 Pain in left shoulder: Secondary | ICD-10-CM | POA: Diagnosis not present

## 2019-10-09 DIAGNOSIS — M353 Polymyalgia rheumatica: Secondary | ICD-10-CM | POA: Diagnosis not present

## 2019-10-09 DIAGNOSIS — M25511 Pain in right shoulder: Secondary | ICD-10-CM | POA: Diagnosis not present

## 2019-10-09 DIAGNOSIS — M130 Polyarthritis, unspecified: Secondary | ICD-10-CM | POA: Diagnosis not present

## 2019-10-09 DIAGNOSIS — M79641 Pain in right hand: Secondary | ICD-10-CM | POA: Diagnosis not present

## 2019-10-09 DIAGNOSIS — M25552 Pain in left hip: Secondary | ICD-10-CM | POA: Diagnosis not present

## 2019-10-09 DIAGNOSIS — A770 Spotted fever due to Rickettsia rickettsii: Secondary | ICD-10-CM | POA: Diagnosis not present

## 2019-10-09 DIAGNOSIS — M25551 Pain in right hip: Secondary | ICD-10-CM | POA: Diagnosis not present

## 2019-10-23 DIAGNOSIS — M542 Cervicalgia: Secondary | ICD-10-CM | POA: Diagnosis not present

## 2019-10-23 DIAGNOSIS — R5383 Other fatigue: Secondary | ICD-10-CM | POA: Diagnosis not present

## 2019-10-23 DIAGNOSIS — M353 Polymyalgia rheumatica: Secondary | ICD-10-CM | POA: Diagnosis not present

## 2019-10-23 DIAGNOSIS — A770 Spotted fever due to Rickettsia rickettsii: Secondary | ICD-10-CM | POA: Diagnosis not present

## 2019-10-23 DIAGNOSIS — G7000 Myasthenia gravis without (acute) exacerbation: Secondary | ICD-10-CM | POA: Diagnosis not present

## 2019-10-23 DIAGNOSIS — M25511 Pain in right shoulder: Secondary | ICD-10-CM | POA: Diagnosis not present

## 2019-10-23 DIAGNOSIS — M25552 Pain in left hip: Secondary | ICD-10-CM | POA: Diagnosis not present

## 2019-10-23 DIAGNOSIS — E785 Hyperlipidemia, unspecified: Secondary | ICD-10-CM | POA: Diagnosis not present

## 2019-10-23 DIAGNOSIS — M25551 Pain in right hip: Secondary | ICD-10-CM | POA: Diagnosis not present

## 2019-10-23 DIAGNOSIS — M25512 Pain in left shoulder: Secondary | ICD-10-CM | POA: Diagnosis not present

## 2019-11-05 DIAGNOSIS — Z299 Encounter for prophylactic measures, unspecified: Secondary | ICD-10-CM | POA: Diagnosis not present

## 2019-11-05 DIAGNOSIS — Z125 Encounter for screening for malignant neoplasm of prostate: Secondary | ICD-10-CM | POA: Diagnosis not present

## 2019-11-05 DIAGNOSIS — Z7189 Other specified counseling: Secondary | ICD-10-CM | POA: Diagnosis not present

## 2019-11-05 DIAGNOSIS — Z1339 Encounter for screening examination for other mental health and behavioral disorders: Secondary | ICD-10-CM | POA: Diagnosis not present

## 2019-11-05 DIAGNOSIS — M353 Polymyalgia rheumatica: Secondary | ICD-10-CM | POA: Diagnosis not present

## 2019-11-05 DIAGNOSIS — Z6825 Body mass index (BMI) 25.0-25.9, adult: Secondary | ICD-10-CM | POA: Diagnosis not present

## 2019-11-05 DIAGNOSIS — Z79899 Other long term (current) drug therapy: Secondary | ICD-10-CM | POA: Diagnosis not present

## 2019-11-05 DIAGNOSIS — Z1331 Encounter for screening for depression: Secondary | ICD-10-CM | POA: Diagnosis not present

## 2019-11-05 DIAGNOSIS — E039 Hypothyroidism, unspecified: Secondary | ICD-10-CM | POA: Diagnosis not present

## 2019-11-05 DIAGNOSIS — Z0000 Encounter for general adult medical examination without abnormal findings: Secondary | ICD-10-CM | POA: Diagnosis not present

## 2019-11-05 DIAGNOSIS — E7800 Pure hypercholesterolemia, unspecified: Secondary | ICD-10-CM | POA: Diagnosis not present

## 2019-11-09 DIAGNOSIS — Z13820 Encounter for screening for osteoporosis: Secondary | ICD-10-CM | POA: Diagnosis not present

## 2019-12-05 DIAGNOSIS — Z1211 Encounter for screening for malignant neoplasm of colon: Secondary | ICD-10-CM | POA: Diagnosis not present

## 2019-12-24 DIAGNOSIS — M2550 Pain in unspecified joint: Secondary | ICD-10-CM | POA: Diagnosis not present

## 2019-12-24 DIAGNOSIS — R69 Illness, unspecified: Secondary | ICD-10-CM | POA: Diagnosis not present

## 2019-12-24 DIAGNOSIS — R972 Elevated prostate specific antigen [PSA]: Secondary | ICD-10-CM | POA: Diagnosis not present

## 2020-01-08 DIAGNOSIS — Z01818 Encounter for other preprocedural examination: Secondary | ICD-10-CM | POA: Diagnosis not present

## 2020-01-10 DIAGNOSIS — Z79890 Hormone replacement therapy (postmenopausal): Secondary | ICD-10-CM | POA: Diagnosis not present

## 2020-01-10 DIAGNOSIS — I10 Essential (primary) hypertension: Secondary | ICD-10-CM | POA: Diagnosis not present

## 2020-01-10 DIAGNOSIS — Z7982 Long term (current) use of aspirin: Secondary | ICD-10-CM | POA: Diagnosis not present

## 2020-01-10 DIAGNOSIS — E039 Hypothyroidism, unspecified: Secondary | ICD-10-CM | POA: Diagnosis not present

## 2020-01-10 DIAGNOSIS — K5730 Diverticulosis of large intestine without perforation or abscess without bleeding: Secondary | ICD-10-CM | POA: Diagnosis not present

## 2020-01-10 DIAGNOSIS — Z1211 Encounter for screening for malignant neoplasm of colon: Secondary | ICD-10-CM | POA: Diagnosis not present

## 2020-01-10 DIAGNOSIS — E785 Hyperlipidemia, unspecified: Secondary | ICD-10-CM | POA: Diagnosis not present

## 2020-01-10 DIAGNOSIS — Z79899 Other long term (current) drug therapy: Secondary | ICD-10-CM | POA: Diagnosis not present

## 2020-01-22 DIAGNOSIS — Z13820 Encounter for screening for osteoporosis: Secondary | ICD-10-CM | POA: Diagnosis not present

## 2020-01-22 DIAGNOSIS — M353 Polymyalgia rheumatica: Secondary | ICD-10-CM | POA: Diagnosis not present

## 2020-01-22 DIAGNOSIS — M7061 Trochanteric bursitis, right hip: Secondary | ICD-10-CM | POA: Diagnosis not present

## 2020-01-22 DIAGNOSIS — Z79899 Other long term (current) drug therapy: Secondary | ICD-10-CM | POA: Diagnosis not present

## 2020-01-22 DIAGNOSIS — R5383 Other fatigue: Secondary | ICD-10-CM | POA: Diagnosis not present

## 2020-01-22 DIAGNOSIS — I10 Essential (primary) hypertension: Secondary | ICD-10-CM | POA: Diagnosis not present

## 2020-01-22 DIAGNOSIS — A770 Spotted fever due to Rickettsia rickettsii: Secondary | ICD-10-CM | POA: Diagnosis not present

## 2020-01-22 DIAGNOSIS — M542 Cervicalgia: Secondary | ICD-10-CM | POA: Diagnosis not present

## 2020-01-22 DIAGNOSIS — G7000 Myasthenia gravis without (acute) exacerbation: Secondary | ICD-10-CM | POA: Diagnosis not present

## 2020-01-22 DIAGNOSIS — E039 Hypothyroidism, unspecified: Secondary | ICD-10-CM | POA: Diagnosis not present

## 2020-01-22 DIAGNOSIS — E785 Hyperlipidemia, unspecified: Secondary | ICD-10-CM | POA: Diagnosis not present

## 2020-02-01 DIAGNOSIS — I10 Essential (primary) hypertension: Secondary | ICD-10-CM | POA: Diagnosis not present

## 2020-02-01 DIAGNOSIS — K219 Gastro-esophageal reflux disease without esophagitis: Secondary | ICD-10-CM | POA: Diagnosis not present

## 2020-02-01 DIAGNOSIS — E039 Hypothyroidism, unspecified: Secondary | ICD-10-CM | POA: Diagnosis not present

## 2020-02-01 DIAGNOSIS — E7849 Other hyperlipidemia: Secondary | ICD-10-CM | POA: Diagnosis not present

## 2020-02-13 DIAGNOSIS — Z789 Other specified health status: Secondary | ICD-10-CM | POA: Diagnosis not present

## 2020-02-13 DIAGNOSIS — Z6825 Body mass index (BMI) 25.0-25.9, adult: Secondary | ICD-10-CM | POA: Diagnosis not present

## 2020-02-13 DIAGNOSIS — E039 Hypothyroidism, unspecified: Secondary | ICD-10-CM | POA: Diagnosis not present

## 2020-02-13 DIAGNOSIS — R972 Elevated prostate specific antigen [PSA]: Secondary | ICD-10-CM | POA: Diagnosis not present

## 2020-02-13 DIAGNOSIS — Z299 Encounter for prophylactic measures, unspecified: Secondary | ICD-10-CM | POA: Diagnosis not present

## 2020-02-13 DIAGNOSIS — D692 Other nonthrombocytopenic purpura: Secondary | ICD-10-CM | POA: Diagnosis not present

## 2020-02-13 DIAGNOSIS — I10 Essential (primary) hypertension: Secondary | ICD-10-CM | POA: Diagnosis not present

## 2020-03-18 ENCOUNTER — Ambulatory Visit (INDEPENDENT_AMBULATORY_CARE_PROVIDER_SITE_OTHER): Payer: Medicare HMO | Admitting: Urology

## 2020-03-18 ENCOUNTER — Other Ambulatory Visit: Payer: Self-pay

## 2020-03-18 ENCOUNTER — Encounter: Payer: Self-pay | Admitting: Urology

## 2020-03-18 VITALS — BP 145/72 | HR 69 | Temp 98.3°F | Ht 64.0 in | Wt 172.0 lb

## 2020-03-18 DIAGNOSIS — R972 Elevated prostate specific antigen [PSA]: Secondary | ICD-10-CM

## 2020-03-18 DIAGNOSIS — N400 Enlarged prostate without lower urinary tract symptoms: Secondary | ICD-10-CM | POA: Diagnosis not present

## 2020-03-18 LAB — URINALYSIS, ROUTINE W REFLEX MICROSCOPIC
Bilirubin, UA: NEGATIVE
Glucose, UA: NEGATIVE
Leukocytes,UA: NEGATIVE
Nitrite, UA: NEGATIVE
Protein,UA: NEGATIVE
RBC, UA: NEGATIVE
Specific Gravity, UA: 1.025 (ref 1.005–1.030)
Urobilinogen, Ur: 0.2 mg/dL (ref 0.2–1.0)
pH, UA: 5 (ref 5.0–7.5)

## 2020-03-18 NOTE — Progress Notes (Signed)
H&P  Chief Complaint: Elevated PSA  History of Present Illness:   2.15.2022: "Arthur Hart" is here today following referral of his elevated PSA (4.7 on 11.22.2021). He has a fhx of PCa w/ brachytherapy and f/u prostatectomy (Father). He reports a weakened FOS, some urgency/frequency, and nocturia x3-4. He has a h/o kidney stones and experienced a UTI a few years ago.  PSA data: 9.15.11 - 3.47 9.25.12 - 2.91 4.29.14 - 3.91 10.27.15 - 4.46 11.22.21 - 4.7 12.03.21 - 5.5  IPSS Questionnaire (AUA-7): Over the past month.   1)  How often have you had a sensation of not emptying your bladder completely after you finish urinating?  3 - About half the time  2)  How often have you had to urinate again less than two hours after you finished urinating? 5 - Almost always  3)  How often have you found you stopped and started again several times when you urinated?  4 - More than half the time  4) How difficult have you found it to postpone urination?  4 - More than half the time  5) How often have you had a weak urinary stream?  5 - Almost always  6) How often have you had to push or strain to begin urination?  1 - Less than 1 time in 5  7) How many times did you most typically get up to urinate from the time you went to bed until the time you got up in the morning?  4 - 4 times  Total score:  0-7 mildly symptomatic   8-19 moderately symptomatic   20-35 severely symptomatic  IPSS: 26 QoL Score: 2   Past Medical History:  Diagnosis Date  . Coronary atherosclerosis of native coronary artery   . Essential hypertension, benign   . Insomnia, unspecified   . Myasthenia gravis without exacerbation ([place])   . Other and unspecified hyperlipidemia       Home Medications:  Allergies as of 03/18/2020      Reactions   Beta Adrenergic Blockers Other (See Comments)   contraindicated in myasthenia gravis      Medication List       Accurate as of March 18, 2020  1:13 PM. If you have any questions, ask  your nurse or doctor.        ALPRAZolam 0.5 MG tablet Commonly known as: XANAX Take 0.5 mg by mouth at bedtime as needed.   aspirin 325 MG tablet Take 325 mg by mouth daily.   atorvastatin 20 MG tablet Commonly known as: LIPITOR Take 20 mg by mouth daily.   Caduet 10-80 MG tablet Generic drug: amLODipine-atorvastatin Take 1 tablet by mouth daily.   isosorbide mononitrate 60 MG 24 hr tablet Commonly known as: IMDUR Take 60 mg by mouth daily.   levothyroxine 175 MCG tablet Commonly known as: SYNTHROID Take 175 mcg by mouth daily.   lisinopril-hydrochlorothiazide 10-12.5 MG tablet Commonly known as: ZESTORETIC Take 1 tablet by mouth daily.   nitroGLYCERIN 0.4 MG SL tablet Commonly known as: NITROSTAT Place 0.4 mg under the tongue every 5 (five) minutes as needed.   temazepam 30 MG capsule Commonly known as: RESTORIL Take 30 mg by mouth at bedtime as needed.   traZODone 50 MG tablet Commonly known as: DESYREL Take 50 mg by mouth at bedtime.       Allergies:  Allergies  Allergen Reactions  . Beta Adrenergic Blockers Other (See Comments)    contraindicated in myasthenia gravis    No  family history on file.  Social History:  reports that he does not drink alcohol and does not use drugs. No history on file for tobacco use.  ROS: A complete review of systems was performed.  All systems are negative except for pertinent findings as noted.  Physical Exam:  Vital signs in last 24 hours: There were no vitals taken for this visit. Constitutional:  Alert and oriented, No acute distress Cardiovascular: Regular rate Respiratory: Normal respiratory effort GI: Abdomen is soft, nontender, nondistended, no abdominal masses. No CVAT. Hernia (R) Genitourinary: Normal male phallus, testes are descended bilaterally and non-tender and without masses, scrotum is normal in appearance without lesions or masses, perineum is normal on inspection. Prostate feels about 40  grams. Neurologic: Grossly intact, no focal deficits Psychiatric: Normal mood and affect  I have reviewed prior pt notes  I have reviewed notes from referring/previous physicians  I have reviewed urinalysis results  I have reviewed prior PSA results    Impression/Assessment:  Elevated PSA: DRE shows prostate is approximately 40 grams. His recent results show that his trend shows long-term, slight elevation.  Plan:  1. Pt advised regarding PSA monitoring and significance. He was oriented to standard response to an elevating trend - TRUSP/bx  2. Pt reassured regarding his PSA trend. PSA pending for today.  3. F/U in 6 months for OV, PSA, and symptom recheck.  CC: Dr. Monico Blitz

## 2020-03-18 NOTE — Progress Notes (Signed)
Urological Symptom Review   Patient is experiencing the following symptoms: Frequent urination Hard to postpone urination Get up at night to urinate Weak stream Erection problems (male only)   Review of Systems  Gastrointestinal (upper)  : Negative for upper GI symptoms  Gastrointestinal (lower) : Negative for lower GI symptoms  Constitutional : Negative for symptoms  Skin: Negative for skin symptoms  Eyes: Negative for eye symptoms  Ear/Nose/Throat : Negative for Ear/Nose/Throat symptoms  Hematologic/Lymphatic: Negative for Hematologic/Lymphatic symptoms  Cardiovascular : Negative for cardiovascular symptoms  Respiratory : Negative for respiratory symptoms  Endocrine: Negative for endocrine symptoms  Musculoskeletal: Negative for musculoskeletal symptoms  Neurological: Negative for neurological symptoms  Psychologic: Negative for psychiatric symptoms  

## 2020-03-19 LAB — PSA: Prostate Specific Ag, Serum: 5.3 ng/mL — ABNORMAL HIGH (ref 0.0–4.0)

## 2020-03-20 ENCOUNTER — Other Ambulatory Visit: Payer: Self-pay | Admitting: Urology

## 2020-03-20 DIAGNOSIS — R972 Elevated prostate specific antigen [PSA]: Secondary | ICD-10-CM

## 2020-03-20 MED ORDER — LEVOFLOXACIN 750 MG PO TABS: 750.0000 mg | ORAL_TABLET | Freq: Every day | ORAL | 0 refills | Status: AC

## 2020-03-21 ENCOUNTER — Telehealth: Payer: Self-pay

## 2020-03-21 NOTE — Telephone Encounter (Signed)
-----   Message from [name], MD sent at 03/20/2020  6:31 PM EST ----- Please call pt--psa up to 5.3--I would recommend TRUS/Bx. Orders sent in ----- Message ----- From: Iris Pert, LPN Sent: 02/25/4830   8:20 AM EST To: [name], MD  Please review

## 2020-03-21 NOTE — Telephone Encounter (Signed)
Called pt. Notified of PSA results and of biopsy suggestion from doctor. Pt was scheduled, instructions were gone over and date and time of appointment given. Instructions and aappointment date and time also mailed.

## 2020-03-24 ENCOUNTER — Other Ambulatory Visit: Payer: Self-pay | Admitting: Urology

## 2020-03-24 DIAGNOSIS — R972 Elevated prostate specific antigen [PSA]: Secondary | ICD-10-CM

## 2020-04-07 NOTE — Progress Notes (Signed)
Risks, benefits, and some of the potential complications of a transrectal ultrasounds of the prostate (TRUSP) with biopsies were discussed at length with the patient including gross hematuria, blood in the bowel movements, hematospermia, bacteremia, infection, voiding discomfort, urinary retention, fever, chills, sepsis, blood transfusion, death, and others. All questions were answered. Informed consent was obtained. The patient confirmed that he had taken his pre-procedure antibiotic. All anticoagulants were discontinued prior to the procedure. The patient emptied his bladder. He was positioned in a comfortable left lateral decubitus position with hips and knees acutely flexed.  The rectal probe was inserted into the rectum without difficulty. 10cc of 2% Lidocaine without epinephrine was instilled with a spinal needle using ultrasound guidance near the junction of each seminal vesicle and the prostate.  Sequential transverse (axial) scans were made in small increments beginning at the seminal vesicles and ending at the prostatic apex. Sequential longitudinal (saggital) scans were made in small increments beginning at the right lateral prostate and ending at the left lateral prostate. Excellent anatomical imaging was obtained. The peripheral, transitional, and central zones were well-defined. The seminal vesicles were normal.  Prostate volume 15.5 ml.  There were no hypoechoic areas. 12 needle core biopsies were performed. 1 biopsy each was taken from the following areas:  Right lateral base, right medial base, right lateral mid prostate, right medial mid prostate, right lateral apical prostate, right medial apical prostate, left lateral base, left medial base, left lateral mid prostate, left medial mid prostate, left lateral apical prostate, left medial apical prostate.. Minimal prostatic calcifications were noted. Excellent biopsy specimens were obtained.  Follow-up rectal examination was unremarkable.  The procedure was well-tolerated and without complications. Antibiotic instructions were given. The patient was told that:  For several days:  he should increase his fluid intake and limit strenuous activity  he might have mild discomfort at the base of his penis or in his rectum  he might have blood in his urine or blood in his bowel movements  For 2-3 months:  he might have blood in his ejaculate (semen)  Instructions were given to call the office immedicately for blood clots in the urine or bowel movements, difficulty urinating, inability to urinate, urinary retention, painful or frequent urination, fever, chills, nausea, vomiting, or other illness. The patient stated that he understood these instructions and would comply with them. We told the patient that prostate biopsy pathology reports are usually available within 3-5 working days, unless a pathologic second opinion is required, which may take 7-14 days. We told him to contact us to check on the status of his biopsy if he has not heard from [place] within 7 days. The patient left the ultrasound examination room in stable condition.

## 2020-04-08 ENCOUNTER — Other Ambulatory Visit: Payer: Self-pay

## 2020-04-08 ENCOUNTER — Other Ambulatory Visit: Payer: Self-pay | Admitting: Urology

## 2020-04-08 ENCOUNTER — Ambulatory Visit (INDEPENDENT_AMBULATORY_CARE_PROVIDER_SITE_OTHER): Payer: Medicare HMO | Admitting: Urology

## 2020-04-08 ENCOUNTER — Ambulatory Visit (HOSPITAL_COMMUNITY)
Admission: RE | Admit: 2020-04-08 | Discharge: 2020-04-08 | Disposition: A | Discharge status: Home / Self Care | Payer: Medicare HMO | Source: Ambulatory Visit | Attending: Urology | Admitting: Urology

## 2020-04-08 DIAGNOSIS — C61 Malignant neoplasm of prostate: Secondary | ICD-10-CM | POA: Insufficient documentation

## 2020-04-08 DIAGNOSIS — R972 Elevated prostate specific antigen [PSA]: Secondary | ICD-10-CM

## 2020-04-08 HISTORY — PX: OTHER SURGICAL HISTORY: SHX169

## 2020-04-08 MED ORDER — LIDOCAINE HCL (PF) 2 % IJ SOLN
INTRAMUSCULAR | Status: AC
  Administered 2020-04-08: 10 mL
  Filled 2020-04-08: qty 10

## 2020-04-08 MED ORDER — GENTAMICIN SULFATE 40 MG/ML IJ SOLN
INTRAMUSCULAR | Status: AC
  Administered 2020-04-08: 160 mg via INTRAMUSCULAR
  Filled 2020-04-08: qty 4

## 2020-04-08 MED ORDER — GENTAMICIN SULFATE 40 MG/ML IJ SOLN: 160.0000 mg | Freq: Once | INTRAMUSCULAR | Status: AC

## 2020-04-11 ENCOUNTER — Telehealth: Payer: Self-pay

## 2020-04-11 NOTE — Telephone Encounter (Signed)
Patient is asking for biopsy results- wife cell number [phone] (patient cell service not good)  I called wife and notified results sent to Dr. Diona Fanti.

## 2020-04-11 NOTE — Telephone Encounter (Signed)
Patient calling back for results from his recent biopsy. Please call pt back at today please at: [phone] or [phone] (wife) if cannot be reached on his cell.  Wants to know if the results have or have not come in.  Thanks, Helene Kelp

## 2020-04-16 ENCOUNTER — Telehealth: Payer: Self-pay

## 2020-04-16 NOTE — Telephone Encounter (Signed)
Called pt and scheduled appointment for next Tuesday 3/22 to talk with Dr. Diona Fanti about biopsy results.

## 2020-04-17 DIAGNOSIS — M353 Polymyalgia rheumatica: Secondary | ICD-10-CM | POA: Diagnosis not present

## 2020-04-17 DIAGNOSIS — R5383 Other fatigue: Secondary | ICD-10-CM | POA: Diagnosis not present

## 2020-04-17 DIAGNOSIS — Z79899 Other long term (current) drug therapy: Secondary | ICD-10-CM | POA: Diagnosis not present

## 2020-04-22 ENCOUNTER — Other Ambulatory Visit: Payer: Self-pay

## 2020-04-22 ENCOUNTER — Encounter: Payer: Self-pay | Admitting: Urology

## 2020-04-22 ENCOUNTER — Ambulatory Visit (INDEPENDENT_AMBULATORY_CARE_PROVIDER_SITE_OTHER): Payer: Medicare HMO | Admitting: Urology

## 2020-04-22 DIAGNOSIS — R5383 Other fatigue: Secondary | ICD-10-CM | POA: Diagnosis not present

## 2020-04-22 DIAGNOSIS — Z13820 Encounter for screening for osteoporosis: Secondary | ICD-10-CM | POA: Diagnosis not present

## 2020-04-22 DIAGNOSIS — I10 Essential (primary) hypertension: Secondary | ICD-10-CM | POA: Diagnosis not present

## 2020-04-22 DIAGNOSIS — E039 Hypothyroidism, unspecified: Secondary | ICD-10-CM | POA: Diagnosis not present

## 2020-04-22 DIAGNOSIS — G7000 Myasthenia gravis without (acute) exacerbation: Secondary | ICD-10-CM | POA: Diagnosis not present

## 2020-04-22 DIAGNOSIS — M353 Polymyalgia rheumatica: Secondary | ICD-10-CM | POA: Diagnosis not present

## 2020-04-22 DIAGNOSIS — C61 Malignant neoplasm of prostate: Secondary | ICD-10-CM

## 2020-04-22 DIAGNOSIS — A770 Spotted fever due to Rickettsia rickettsii: Secondary | ICD-10-CM | POA: Diagnosis not present

## 2020-04-22 DIAGNOSIS — E663 Overweight: Secondary | ICD-10-CM | POA: Diagnosis not present

## 2020-04-22 DIAGNOSIS — E785 Hyperlipidemia, unspecified: Secondary | ICD-10-CM | POA: Diagnosis not present

## 2020-04-22 NOTE — Progress Notes (Signed)
History of Present Illness: Pt presents for PCa conference.  3.8.2022: TRUS/Bx. PSA 5.3, prostate volume 15.5 mL, PSAD 0.34. 4/12 cores revealed adenocarcinoma- 2 cores (right mid medial, left mid lateral) revealed GS 3+3 pattern in 10 and 30% of core respectively. 2 cores (left mid medial, left apex lateral) revealed GS 3+4 pattern in 5 and 30% of core respectively.  Past Medical History:  Diagnosis Date  . Coronary atherosclerosis of native coronary artery   . Essential hypertension, benign   . Heart disease   . Heart murmur   . Hormone disorder   . Insomnia, unspecified   . Kidney stone   . Myasthenia gravis without exacerbation ([place])   . Other and unspecified hyperlipidemia     Past Surgical History:  Procedure Laterality Date  . THYROIDECTOMY  1969    Home Medications:  Allergies as of 04/22/2020      Reactions   Beta Adrenergic Blockers Other (See Comments)   contraindicated in myasthenia gravis      Medication List       Accurate as of April 22, 2020  6:36 AM. If you have any questions, ask your nurse or doctor.        ALPRAZolam 0.5 MG tablet Commonly known as: XANAX Take 0.5 mg by mouth at bedtime as needed.   amLODipine 5 MG tablet Commonly known as: NORVASC Take 5 mg by mouth daily.   amLODipine-atorvastatin 10-80 MG tablet Commonly known as: CADUET Take 1 tablet by mouth daily.   ascorbic acid 100 MG tablet Commonly known as: VITAMIN C Take by mouth.   aspirin 325 MG tablet Take 325 mg by mouth daily.   atorvastatin 20 MG tablet Commonly known as: LIPITOR Take 20 mg by mouth daily.   HYDROcodone-acetaminophen 5-325 MG tablet Commonly known as: NORCO/VICODIN   isosorbide mononitrate 60 MG 24 hr tablet Commonly known as: IMDUR Take 60 mg by mouth daily.   levothyroxine 175 MCG tablet Commonly known as: SYNTHROID Take 175 mcg by mouth daily.   Euthyrox 150 MCG tablet Generic drug: levothyroxine Take 150 mcg by mouth daily.    lisinopril-hydrochlorothiazide 10-12.5 MG tablet Commonly known as: ZESTORETIC Take 1 tablet by mouth daily.   Multi-Vitamin tablet Take 1 tablet by mouth daily.   nitroGLYCERIN 0.4 MG SL tablet Commonly known as: NITROSTAT Place 0.4 mg under the tongue every 5 (five) minutes as needed.   predniSONE 5 MG tablet Commonly known as: DELTASONE Take by mouth.   sildenafil 20 MG tablet Commonly known as: REVATIO Take 20 mg by mouth as directed.   temazepam 30 MG capsule Commonly known as: RESTORIL Take 30 mg by mouth at bedtime as needed.   traMADol 50 MG tablet Commonly known as: ULTRAM tramadol 50 mg tablet   traZODone 50 MG tablet Commonly known as: DESYREL Take 50 mg by mouth at bedtime.       Allergies:  Allergies  Allergen Reactions  . Beta Adrenergic Blockers Other (See Comments)    contraindicated in myasthenia gravis    No family history on file.  Social History:  reports that he has never smoked. He has never used smokeless tobacco. He reports that he does not drink alcohol and does not use drugs.  ROS: A complete review of systems was performed.  All systems are negative except for pertinent findings as noted.  Physical Exam:  Vital signs in last 24 hours: There were no vitals taken for this visit. Constitutional:  Alert and oriented, No acute distress  Cardiovascular: Regular rate  Respiratory: Normal respiratory effort Neurologic: Grossly intact, no focal deficits Psychiatric: Normal mood and affect   I have reviewed prior pt notes  I have reviewed urinalysis results  I have reviewed prior PSA pathology results     Impression/Assessment:  Favorable intermediate risk/GG 2 adenocarcinoma the prostate. Organ confined disease-63% Lymph node/seminal vesicle involvement--3% each 5/10-year progression free probability after radical prostatectomy--86/77% respectively  Plan:  The patient and his wife were counseled about the natural history of  prostate cancer and the standard treatment options that are available for prostate cancer. It was explained to him how his age and life expectancy, clinical stage, Gleason score, and PSA affect his prognosis, the decision to proceed with additional staging studies, as well as how that information influences recommended treatment strategies. We discussed the roles for active surveillance, radiation therapy, surgical therapy, androgen deprivation, as well as ablative therapy options for the treatment of prostate cancer as appropriate to his individual cancer situation. We discussed the risks and benefits of these options with regard to their impact on cancer control and also in terms of potential adverse events, complications, and impact on quality of life particularly related to urinary, bowel, and sexual function. The patient was encouraged to ask questions throughout the discussion today and all questions were answered to his stated satisfaction. In addition, the patient was provided with and/or directed to appropriate resources and literature for further education about prostate cancer and treatment options.  He is undecided about which way he would want to go.  He is interested in both surgery and brachytherapy.  He will call us back in the next week or 2 to let us know if he wants consultation with a robotic surgeon, Dr. [name], or both.

## 2020-04-28 ENCOUNTER — Telehealth: Payer: Self-pay

## 2020-04-28 NOTE — Telephone Encounter (Signed)
Pt needing a call back to discuss plan of care.  He has changed his mind with care he is wanting.  Please call pt back at [phone].  Thanks, Helene Kelp

## 2020-04-29 ENCOUNTER — Other Ambulatory Visit: Payer: Self-pay | Admitting: Urology

## 2020-04-29 DIAGNOSIS — C61 Malignant neoplasm of prostate: Secondary | ICD-10-CM

## 2020-04-29 NOTE — Telephone Encounter (Signed)
Put order in for radiation consult.

## 2020-05-06 ENCOUNTER — Encounter: Payer: Self-pay | Admitting: Radiation Oncology

## 2020-05-06 NOTE — Progress Notes (Signed)
GU Location of Tumor / Histology: Adenocarcinoma of prostate  If Prostate Cancer, Gleason Score is (3 + 4) and PSA is (5.3). Prostate volume: 15.5 mL  Jillyn Hidden presented with a rising PSA.     Biopsies of prostate (if applicable)  revealed: Past/Anticipated interventions by urology, if any: prostate biopsy, referral to Dr. [name] to discuss brachytherapy  Patient trying to decide between prostatectomy and brachytherapy  Past/Anticipated interventions by medical oncology, if any: no  Weight changes, if any: denies  Bowel/Bladder complaints, if any: IPSS 14. SHIM 19 with aid of sildenafil. Denies dysuria or hematuria. Reports scant leakage related to urgency sometimes. Reports a weakned FOS. Reports urinary frequency and urgency. Reports nocturia x 3-4. Denies any bowel complaints.  Nausea/Vomiting, if any: denies  Pain issues, if any:  Chronic pain  SAFETY ISSUES:  Prior radiation? denies  Pacemaker/ICD? denies  Possible current pregnancy? no, male patient  Is the patient on methotrexate? denies  Current Complaints / other details:  70 year old male. Resides in [place].

## 2020-05-07 ENCOUNTER — Ambulatory Visit
Admission: RE | Admit: 2020-05-07 | Discharge: 2020-05-07 | Disposition: A | Discharge status: Home / Self Care | Payer: Medicare HMO | Source: Ambulatory Visit | Attending: Radiation Oncology | Admitting: Radiation Oncology

## 2020-05-07 ENCOUNTER — Other Ambulatory Visit: Payer: Self-pay

## 2020-05-07 ENCOUNTER — Encounter: Payer: Self-pay | Admitting: Radiation Oncology

## 2020-05-07 DIAGNOSIS — C61 Malignant neoplasm of prostate: Secondary | ICD-10-CM

## 2020-05-07 HISTORY — DX: Malignant neoplasm of prostate: C61

## 2020-05-07 NOTE — Progress Notes (Signed)
Radiation Oncology         [phone]) [phone] ________________________________  Initial Outpatient Consultation - Conducted via telephone due to current COVID-19 concerns for limiting patient exposure  Name: Arthur Hart MRN: [id number]  Date: 05/07/2020  DOB: [birth year]  ON:GEXB, [name], MD  [name], MD   REFERRING PHYSICIAN: [name], MD  DIAGNOSIS: 70 y.o. gentleman with Stage T1c adenocarcinoma of the prostate with Gleason score of 3+4, and PSA of 5.3.    ICD-10-CM   1. Malignant neoplasm of prostate ([place])  C61     HISTORY OF PRESENT ILLNESS: Arthur Hart is a 70 y.o. male with a diagnosis of prostate cancer. He was noted to have an elevated PSA of 4.7 in 12/2019 by his primary care physician, Dr. Manuella Ghazi. A repeat PSA was further elevated at 5.5 on 01/04/2020 so, accordingly, he was referred for evaluation in urology by Dr. Diona Fanti on 03/18/20,  digital rectal examination was performed at that time revealing no nodules or concerning findings.  A repeat PSA that same day remained stable elevated at 5.3.  Therefore, the patient proceeded to transrectal ultrasound with 12 biopsies of the prostate on 04/08/20.  The prostate volume measured 15.5 cc.  Out of 12 core biopsies, 4 were positive.  The maximum Gleason score was 3+4, and this was seen in the left mid and left apex lateral.  Additionally, Gleason 3+3 was seen in the left mid lateral and right mid.    The patient reviewed the biopsy results with his urologist and he has kindly been referred today for discussion of potential radiation treatment options.   PREVIOUS RADIATION THERAPY: No  PAST MEDICAL HISTORY:  Past Medical History:  Diagnosis Date  . Coronary atherosclerosis of native coronary artery   . Essential hypertension, benign   . Heart disease   . Heart murmur   . Hormone disorder   . Insomnia, unspecified   . Kidney stone   . Myasthenia gravis without exacerbation ([place])   . Other and unspecified  hyperlipidemia   . Prostate cancer ([place])       PAST SURGICAL HISTORY: Past Surgical History:  Procedure Laterality Date  . prostate biopsy  04/08/2020  . THYROIDECTOMY  1969    FAMILY HISTORY:  Family History  Problem Relation Age of Onset  . Prostate cancer Father   . Breast cancer Neg Hx   . Colon cancer Neg Hx   . Pancreatic cancer Neg Hx     SOCIAL HISTORY:  Social History   Socioeconomic History  . Marital status: Married    Spouse name: Not on file  . Number of children: 2  . Years of education: Not on file  . Highest education level: Not on file  Occupational History  . Not on file  Tobacco Use  . Smoking status: Never Smoker  . Smokeless tobacco: Never Used  Vaping Use  . Vaping Use: Never used  Substance and Sexual Activity  . Alcohol use: No  . Drug use: No  . Sexual activity: Yes  Other Topics Concern  . Not on file  Social History Narrative  . Not on file   Social Determinants of Health   Financial Resource Strain: Not on file  Food Insecurity: Not on file  Transportation Needs: Not on file  Physical Activity: Not on file  Stress: Not on file  Social Connections: Not on file  Intimate Partner Violence: Not on file    ALLERGIES: Beta adrenergic blockers  MEDICATIONS:  Current  Outpatient Medications  Medication Sig Dispense Refill  . ALPRAZolam (XANAX) 0.5 MG tablet Take 0.5 mg by mouth at bedtime as needed.    Marland Kitchen amLODipine (NORVASC) 5 MG tablet Take 5 mg by mouth daily.    Marland Kitchen amLODipine-atorvastatin (CADUET) 10-80 MG tablet Take 1 tablet by mouth daily. (Patient not taking: Reported on 03/18/2020)    . ascorbic acid (VITAMIN C) 100 MG tablet Take by mouth.    Marland Kitchen aspirin 325 MG tablet Take 325 mg by mouth daily.    Marland Kitchen atorvastatin (LIPITOR) 20 MG tablet Take 20 mg by mouth daily.    . EUTHYROX 150 MCG tablet Take 150 mcg by mouth daily.    Marland Kitchen HYDROcodone-acetaminophen (NORCO/VICODIN) 5-325 MG tablet     . isosorbide mononitrate (IMDUR) 60 MG  24 hr tablet Take 60 mg by mouth daily.    Marland Kitchen levofloxacin (LEVAQUIN) 750 MG tablet Take 750 mg by mouth daily.    Marland Kitchen levothyroxine (SYNTHROID, LEVOTHROID) 175 MCG tablet Take 175 mcg by mouth daily.    Marland Kitchen lisinopril-hydrochlorothiazide (PRINZIDE,ZESTORETIC) 10-12.5 MG per tablet Take 1 tablet by mouth daily.    . Multiple Vitamin (MULTI-VITAMIN) tablet Take 1 tablet by mouth daily.    . nitroGLYCERIN (NITROSTAT) 0.4 MG SL tablet Place 0.4 mg under the tongue every 5 (five) minutes as needed.    . predniSONE (DELTASONE) 5 MG tablet Take by mouth.    . sildenafil (REVATIO) 20 MG tablet Take 20 mg by mouth as directed.    . temazepam (RESTORIL) 30 MG capsule Take 30 mg by mouth at bedtime as needed.    . traMADol (ULTRAM) 50 MG tablet tramadol 50 mg tablet    . traZODone (DESYREL) 50 MG tablet Take 50 mg by mouth at bedtime.     No current facility-administered medications for this encounter.    REVIEW OF SYSTEMS:  On review of systems, the patient reports that he is doing well overall. He denies any chest pain, shortness of breath, cough, fevers, chills, night sweats, unintended weight changes. He denies any bowel disturbances, and denies abdominal pain, nausea or vomiting. He denies any new musculoskeletal or joint aches or pains. His IPSS was 14, indicating moderate urinary symptoms. His SHIM was recorded.    PHYSICAL EXAM:  Wt Readings from Last 3 Encounters:  03/18/20 172 lb (78 kg)  08/16/08 177 lb 9.6 oz (80.6 kg)  12/19/07 185 lb (83.9 kg)   Temp Readings from Last 3 Encounters:  04/08/20 97.9 F (36.6 C) (Oral)  03/18/20 98.3 F (36.8 C)   BP Readings from Last 3 Encounters:  04/08/20 (!) 168/64  03/18/20 (!) 145/72  08/30/19 (!) 153/82   Pulse Readings from Last 3 Encounters:  04/08/20 62  03/18/20 69  08/30/19 61    /10 Unable to assess due to telephone consult visit format.    KPS = 100  100 - Normal; no complaints; no evidence of disease. 90   - Able to carry on  normal activity; minor signs or symptoms of disease. 80   - Normal activity with effort; some signs or symptoms of disease. 48   - Cares for self; unable to carry on normal activity or to do active work. 60   - Requires occasional assistance, but is able to care for most of his personal needs. 50   - Requires considerable assistance and frequent medical care. 52   - Disabled; requires special care and assistance. 30   - Severely disabled; hospital admission is  indicated although death not imminent. 26   - Very sick; hospital admission necessary; active supportive treatment necessary. 10   - Moribund; fatal processes progressing rapidly. 0     - Dead  Karnofsky DA, Abelmann WH, Craver LS and Burchenal JH [phone]) The use of the nitrogen mustards in the palliative treatment of carcinoma: with particular reference to bronchogenic carcinoma Cancer 1 [phone]  LABORATORY DATA:  No results found for: WBC, HGB, HCT, MCV, PLT No results found for: NA, K, CL, CO2 No results found for: ALT, AST, GGT, ALKPHOS, BILITOT   RADIOGRAPHY: No results found.    IMPRESSION/PLAN:  This visit was conducted via telephone to spare the patient unnecessary potential exposure in the healthcare setting during the current COVID-19 pandemic.   1. 70 y.o. gentleman with Stage T1c adenocarcinoma of the prostate with Gleason Score of 3+4, and PSA of 5.3. We discussed the patient's workup and outlined the nature of prostate cancer in this setting. The patient's T stage, Gleason's score, and PSA put him into the favorable intermediate risk group. Accordingly, he is eligible for a variety of potential treatment options including brachytherapy, 5.5 weeks of external radiation, or prostatectomy. We discussed the available radiation techniques, and focused on the details and logistics of delivery. The patient does have a small gland size for brachytherapy boost with a prostate volume of 15.5 gm.  However, this is just higher than our  lower limit of 15 gm. We discussed and outlined the risks, benefits, short and long-term effects associated with radiotherapy and compared and contrasted these with prostatectomy. We discussed the role of SpaceOAR gel in reducing the rectal toxicity associated with radiotherapy. He was encouraged to ask questions that were answered to his stated satisfaction.  At the conclusion of our conversation, the patient is interested in moving forward with prostate seed implant.  We will work on scheduling this procedure in conjunction with Dr. Diona Fanti..   Given current concerns for patient exposure during the COVID-19 pandemic, this encounter was conducted via telephone. The patient was notified in advance and was offered a [place] meeting to allow for face to face communication but unfortunately reported that he/she did not have the appropriate resources/technology to support such a visit and instead preferred to proceed with telephone consult. The patient has given verbal consent for this type of encounter. The time spent during this encounter was 60 minutes. The attendants for this meeting include Tyler Pita MD, Ashlyn Bruning PA-C, and, patient, Arthur Hart. During the encounter, Tyler Pita MD, and Andi Devon,  were located at [facility] Radiation Oncology Department.  Patient, Arthur Hart was located at home.    Nicholos Johns, PA-C    Tyler Pita, MD  [facility] Direct Dial: [phone]  Fax: [phone] [email or web address]  Skype  LinkedIn

## 2020-05-08 DIAGNOSIS — M353 Polymyalgia rheumatica: Secondary | ICD-10-CM | POA: Diagnosis not present

## 2020-05-14 ENCOUNTER — Telehealth: Payer: Self-pay

## 2020-05-14 NOTE — Telephone Encounter (Signed)
Pt calling on status of  appointments being scheduled in [place] to see if he is a candidate for seed implants.  Call back:  [phone]  [phone] - wife  Thanks, Helene Kelp

## 2020-05-15 NOTE — Telephone Encounter (Signed)
I returned patient call- patient is asking why brachytherapy will be performed.   Patient saw Dr. [name] last week and wishes to proceed. I informed patient Dr. Diona Fanti is out of the office this week and would be back next week to return patient call.   Patient voiced understanding.

## 2020-05-27 ENCOUNTER — Other Ambulatory Visit: Payer: Medicare HMO | Admitting: Urology

## 2020-05-28 ENCOUNTER — Telehealth: Payer: Self-pay

## 2020-05-28 NOTE — Telephone Encounter (Signed)
Patient called again today stating he wishes to proceed with brachytherapy. I informed patient I would send another message to his doctor.

## 2020-06-05 NOTE — Telephone Encounter (Signed)
Patient called and made aware. Patient states he wish to have records sent to [facility] in [place]. Patient informed that he would have to sign a release form. Patient voiced understanding and will come sign release.

## 2020-06-11 DIAGNOSIS — R972 Elevated prostate specific antigen [PSA]: Secondary | ICD-10-CM | POA: Diagnosis not present

## 2020-06-11 DIAGNOSIS — I10 Essential (primary) hypertension: Secondary | ICD-10-CM | POA: Diagnosis not present

## 2020-06-11 DIAGNOSIS — R69 Illness, unspecified: Secondary | ICD-10-CM | POA: Diagnosis not present

## 2020-06-11 DIAGNOSIS — Z299 Encounter for prophylactic measures, unspecified: Secondary | ICD-10-CM | POA: Diagnosis not present

## 2020-06-11 DIAGNOSIS — G4700 Insomnia, unspecified: Secondary | ICD-10-CM | POA: Diagnosis not present

## 2020-06-11 DIAGNOSIS — C61 Malignant neoplasm of prostate: Secondary | ICD-10-CM | POA: Diagnosis not present

## 2020-06-13 DIAGNOSIS — R972 Elevated prostate specific antigen [PSA]: Secondary | ICD-10-CM | POA: Diagnosis not present

## 2020-06-13 DIAGNOSIS — E039 Hypothyroidism, unspecified: Secondary | ICD-10-CM | POA: Diagnosis not present

## 2020-06-23 DIAGNOSIS — Z79899 Other long term (current) drug therapy: Secondary | ICD-10-CM | POA: Diagnosis not present

## 2020-06-23 DIAGNOSIS — M353 Polymyalgia rheumatica: Secondary | ICD-10-CM | POA: Diagnosis not present

## 2020-07-18 DIAGNOSIS — Z13820 Encounter for screening for osteoporosis: Secondary | ICD-10-CM | POA: Diagnosis not present

## 2020-07-18 DIAGNOSIS — G7000 Myasthenia gravis without (acute) exacerbation: Secondary | ICD-10-CM | POA: Diagnosis not present

## 2020-07-18 DIAGNOSIS — M353 Polymyalgia rheumatica: Secondary | ICD-10-CM | POA: Diagnosis not present

## 2020-07-18 DIAGNOSIS — E785 Hyperlipidemia, unspecified: Secondary | ICD-10-CM | POA: Diagnosis not present

## 2020-07-18 DIAGNOSIS — A770 Spotted fever due to Rickettsia rickettsii: Secondary | ICD-10-CM | POA: Diagnosis not present

## 2020-07-18 DIAGNOSIS — I10 Essential (primary) hypertension: Secondary | ICD-10-CM | POA: Diagnosis not present

## 2020-07-18 DIAGNOSIS — E663 Overweight: Secondary | ICD-10-CM | POA: Diagnosis not present

## 2020-07-18 DIAGNOSIS — R5383 Other fatigue: Secondary | ICD-10-CM | POA: Diagnosis not present

## 2020-07-18 DIAGNOSIS — E039 Hypothyroidism, unspecified: Secondary | ICD-10-CM | POA: Diagnosis not present

## 2020-07-18 DIAGNOSIS — C61 Malignant neoplasm of prostate: Secondary | ICD-10-CM | POA: Diagnosis not present

## 2020-07-30 DIAGNOSIS — Z6827 Body mass index (BMI) 27.0-27.9, adult: Secondary | ICD-10-CM | POA: Diagnosis not present

## 2020-07-30 DIAGNOSIS — Z299 Encounter for prophylactic measures, unspecified: Secondary | ICD-10-CM | POA: Diagnosis not present

## 2020-07-30 DIAGNOSIS — I10 Essential (primary) hypertension: Secondary | ICD-10-CM | POA: Diagnosis not present

## 2020-07-30 DIAGNOSIS — M7551 Bursitis of right shoulder: Secondary | ICD-10-CM | POA: Diagnosis not present

## 2020-07-30 DIAGNOSIS — C61 Malignant neoplasm of prostate: Secondary | ICD-10-CM | POA: Diagnosis not present

## 2020-07-31 DIAGNOSIS — R3912 Poor urinary stream: Secondary | ICD-10-CM | POA: Diagnosis not present

## 2020-07-31 DIAGNOSIS — R351 Nocturia: Secondary | ICD-10-CM | POA: Diagnosis not present

## 2020-07-31 DIAGNOSIS — C61 Malignant neoplasm of prostate: Secondary | ICD-10-CM | POA: Diagnosis not present

## 2020-08-20 ENCOUNTER — Telehealth: Payer: Self-pay | Admitting: *Deleted

## 2020-08-20 NOTE — Telephone Encounter (Signed)
CALLED PATIENT TO INFORM OF PRE-SEED APPTS. FOR 09-11-20, LVM  FOR A RETURN CALL

## 2020-08-20 NOTE — Telephone Encounter (Signed)
Called patient to inform of pre-seed appts. for 09-11-20, spoke with patient and he is aware of these appts.

## 2020-08-21 ENCOUNTER — Telehealth: Payer: Self-pay | Admitting: *Deleted

## 2020-08-21 NOTE — Telephone Encounter (Signed)
Called patient to inform of implant date of 10-28-20, lvm for a return call

## 2020-09-01 DIAGNOSIS — C61 Malignant neoplasm of prostate: Secondary | ICD-10-CM | POA: Diagnosis not present

## 2020-09-01 DIAGNOSIS — Z299 Encounter for prophylactic measures, unspecified: Secondary | ICD-10-CM | POA: Diagnosis not present

## 2020-09-01 DIAGNOSIS — I959 Hypotension, unspecified: Secondary | ICD-10-CM | POA: Diagnosis not present

## 2020-09-01 DIAGNOSIS — E860 Dehydration: Secondary | ICD-10-CM | POA: Diagnosis not present

## 2020-09-01 DIAGNOSIS — I10 Essential (primary) hypertension: Secondary | ICD-10-CM | POA: Diagnosis not present

## 2020-09-10 ENCOUNTER — Telehealth: Payer: Self-pay | Admitting: *Deleted

## 2020-09-10 NOTE — Progress Notes (Signed)
  Radiation Oncology         [phone]) [phone] ________________________________  Name: Arthur Hart MRN: [id number]  Date: 09/11/2020  DOB: [birth year]  SIMULATION AND TREATMENT PLANNING NOTE PUBIC ARCH STUDY  [id number], [name], MD  [name], MD  DIAGNOSIS: 70 y.o. gentleman with Stage T1c adenocarcinoma of the prostate with Gleason score of 3+4, and PSA of 5.3.  Oncology History  Malignant neoplasm of prostate ([place])  04/08/2020 Cancer Staging   Staging form: Prostate, AJCC 8th Edition - Clinical stage from 04/08/2020: Stage IIB (cT1c, cN0, cM0, PSA: 5.3, Grade Group: 2) - Signed by Freeman Caldron, PA-C on 05/07/2020  Histopathologic type: Adenocarcinoma, NOS  Stage prefix: Initial diagnosis  Prostate specific antigen (PSA) range: Less than 10  Gleason primary pattern: 3  Gleason secondary pattern: 4  Gleason score: 7  Histologic grading system: 5 grade system  Number of biopsy cores examined: 12  Number of biopsy cores positive: 4  Location of positive needle core biopsies: Both sides    05/07/2020 Initial Diagnosis   Malignant neoplasm of prostate ([place])        ICD-10-CM   1. Malignant neoplasm of prostate ([place])  C61       COMPLEX SIMULATION:  The patient presented today for evaluation for possible prostate seed implant. He was brought to the radiation planning suite and placed supine on the CT couch. A 3-dimensional image study set was obtained in upload to the planning computer. There, on each axial slice, I contoured the prostate gland. Then, using three-dimensional radiation planning tools I reconstructed the prostate in view of the structures from the transperineal needle pathway to assess for possible pubic arch interference. In doing so, I did not appreciate any pubic arch interference. Also, the patient's prostate volume was estimated based on the drawn structure. The volume was 16 cc.  Given the pubic arch appearance and prostate volume, patient remains a good  candidate to proceed with prostate seed implant. Today, he freely provided informed written consent to proceed.    PLAN: The patient will undergo prostate seed implant.   ________________________________  [name]. [name], M.D.

## 2020-09-10 NOTE — Telephone Encounter (Signed)
CALLED PATIENT TO REMIND OF PRE-SEED APPTS. FOR 09-11-20, SPOKE WITH PATIENT AND HE IS AWARE OF THESE APPTS.

## 2020-09-11 ENCOUNTER — Other Ambulatory Visit: Payer: Self-pay

## 2020-09-11 ENCOUNTER — Ambulatory Visit
Admission: RE | Admit: 2020-09-11 | Discharge: 2020-09-11 | Disposition: A | Discharge status: Home / Self Care | Payer: Medicare HMO | Source: Ambulatory Visit | Attending: Radiation Oncology | Admitting: Radiation Oncology

## 2020-09-11 ENCOUNTER — Encounter (HOSPITAL_COMMUNITY)
Admission: RE | Admit: 2020-09-11 | Discharge: 2020-09-11 | Disposition: A | Discharge status: Home / Self Care | Payer: Medicare HMO | Source: Ambulatory Visit | Attending: Urology | Admitting: Urology

## 2020-09-11 ENCOUNTER — Ambulatory Visit (HOSPITAL_COMMUNITY)
Admission: RE | Admit: 2020-09-11 | Discharge: 2020-09-11 | Disposition: A | Discharge status: Home / Self Care | Payer: Medicare HMO | Source: Ambulatory Visit | Attending: Urology | Admitting: Urology

## 2020-09-11 ENCOUNTER — Ambulatory Visit
Admission: RE | Admit: 2020-09-11 | Discharge: 2020-09-11 | Disposition: A | Discharge status: Home / Self Care | Payer: Medicare HMO | Source: Ambulatory Visit | Attending: Urology | Admitting: Urology

## 2020-09-11 ENCOUNTER — Encounter: Payer: Self-pay | Admitting: Urology

## 2020-09-11 DIAGNOSIS — Z510 Encounter for antineoplastic radiation therapy: Secondary | ICD-10-CM | POA: Diagnosis not present

## 2020-09-11 DIAGNOSIS — C61 Malignant neoplasm of prostate: Secondary | ICD-10-CM | POA: Insufficient documentation

## 2020-09-11 DIAGNOSIS — Z01818 Encounter for other preprocedural examination: Secondary | ICD-10-CM

## 2020-09-11 IMAGING — DX DG CHEST 2V
2 series · 2 of 2 positions shown · non-contrast
Comparison: No priors.

CLINICAL DATA: 69-year-old male under preoperative evaluation prior
to prostate surgery.

EXAM:
CHEST - 2 VIEW

[chest pa]
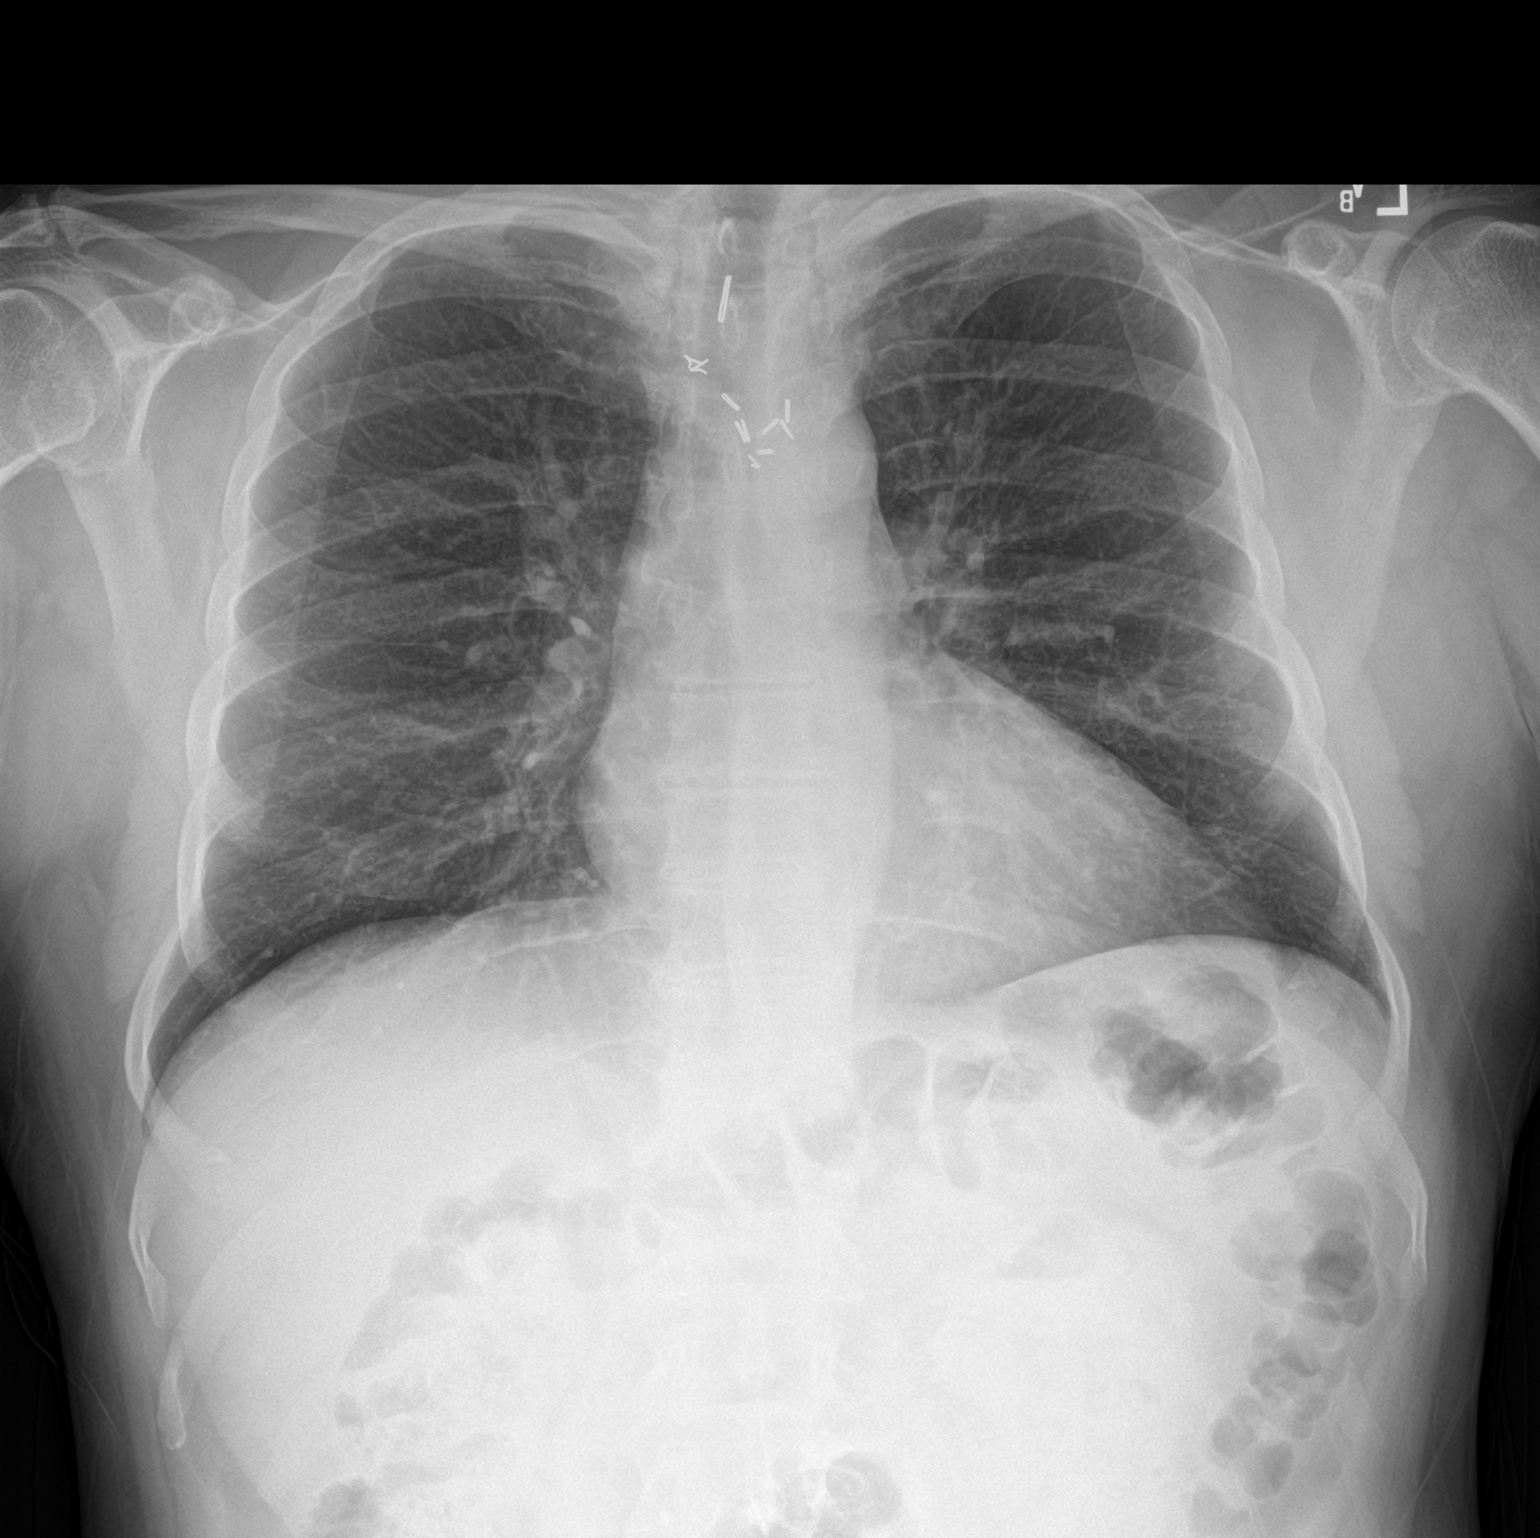

[chest lat]
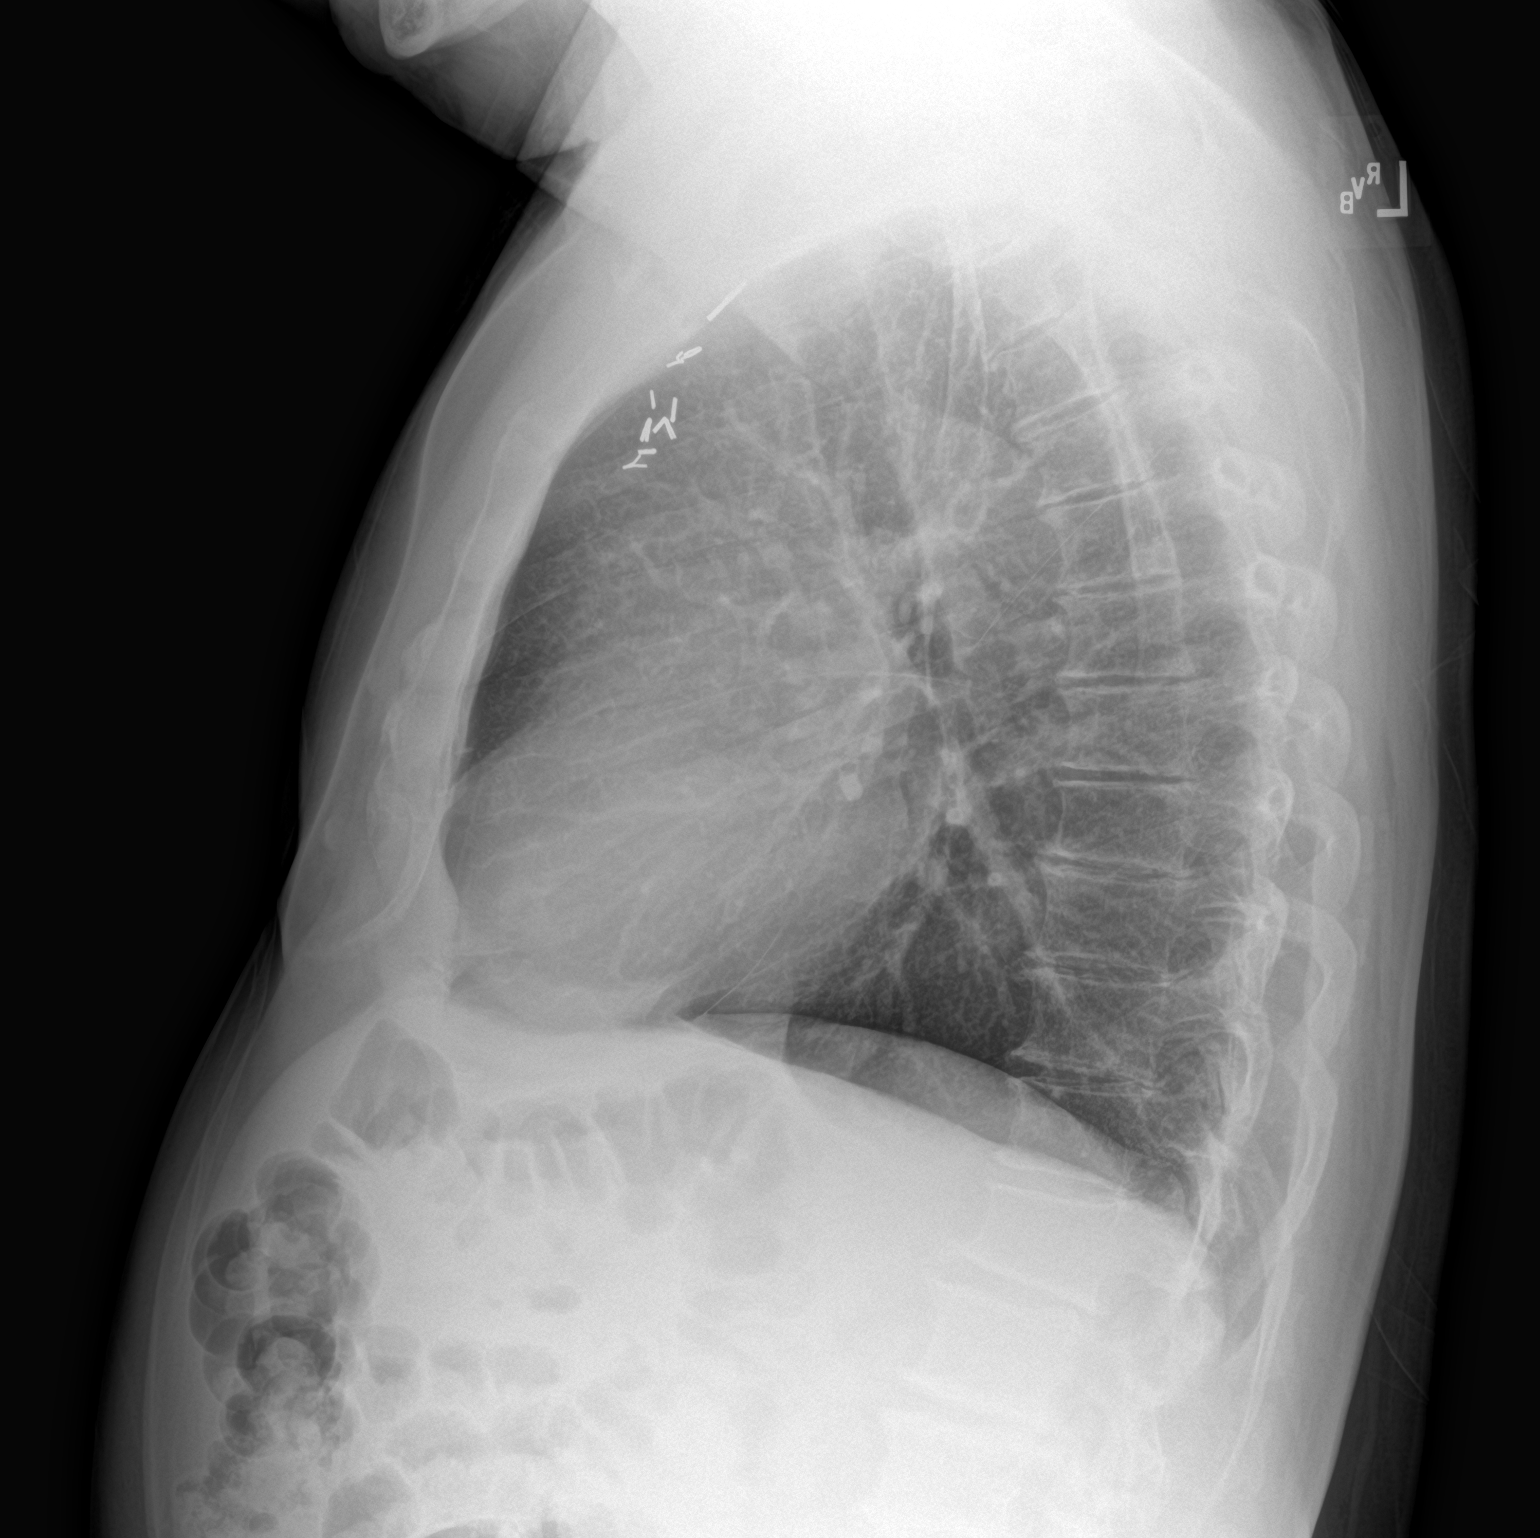

[2 of 2 positions shown; findings below may reference images not displayed]

FINDINGS: Lung volumes are normal. No consolidative airspace disease. No
pleural effusions. No pneumothorax. No pulmonary nodule or mass
noted. Pulmonary vasculature and the cardiomediastinal silhouette
are within normal limits. Multiple surgical clips are noted
throughout the upper anterior mediastinum.
IMPRESSION: 1.  No radiographic evidence of acute cardiopulmonary disease.

## 2020-09-12 DIAGNOSIS — R69 Illness, unspecified: Secondary | ICD-10-CM | POA: Diagnosis not present

## 2020-09-12 DIAGNOSIS — Z789 Other specified health status: Secondary | ICD-10-CM | POA: Diagnosis not present

## 2020-09-12 DIAGNOSIS — Z6827 Body mass index (BMI) 27.0-27.9, adult: Secondary | ICD-10-CM | POA: Diagnosis not present

## 2020-09-12 DIAGNOSIS — M353 Polymyalgia rheumatica: Secondary | ICD-10-CM | POA: Diagnosis not present

## 2020-09-12 DIAGNOSIS — I10 Essential (primary) hypertension: Secondary | ICD-10-CM | POA: Diagnosis not present

## 2020-09-12 DIAGNOSIS — Z299 Encounter for prophylactic measures, unspecified: Secondary | ICD-10-CM | POA: Diagnosis not present

## 2020-09-16 ENCOUNTER — Ambulatory Visit: Payer: Medicare HMO | Admitting: Urology

## 2020-09-17 ENCOUNTER — Other Ambulatory Visit: Payer: Self-pay | Admitting: Urology

## 2020-10-01 DIAGNOSIS — Z0100 Encounter for examination of eyes and vision without abnormal findings: Secondary | ICD-10-CM | POA: Diagnosis not present

## 2020-10-01 DIAGNOSIS — H524 Presbyopia: Secondary | ICD-10-CM | POA: Diagnosis not present

## 2020-10-04 DIAGNOSIS — E669 Obesity, unspecified: Secondary | ICD-10-CM | POA: Diagnosis not present

## 2020-10-04 DIAGNOSIS — Z6831 Body mass index (BMI) 31.0-31.9, adult: Secondary | ICD-10-CM | POA: Diagnosis not present

## 2020-10-04 DIAGNOSIS — M353 Polymyalgia rheumatica: Secondary | ICD-10-CM | POA: Diagnosis not present

## 2020-10-04 DIAGNOSIS — I10 Essential (primary) hypertension: Secondary | ICD-10-CM | POA: Diagnosis not present

## 2020-10-04 DIAGNOSIS — E039 Hypothyroidism, unspecified: Secondary | ICD-10-CM | POA: Diagnosis not present

## 2020-10-04 DIAGNOSIS — Z008 Encounter for other general examination: Secondary | ICD-10-CM | POA: Diagnosis not present

## 2020-10-04 DIAGNOSIS — G7000 Myasthenia gravis without (acute) exacerbation: Secondary | ICD-10-CM | POA: Diagnosis not present

## 2020-10-04 DIAGNOSIS — Z7952 Long term (current) use of systemic steroids: Secondary | ICD-10-CM | POA: Diagnosis not present

## 2020-10-04 DIAGNOSIS — N529 Male erectile dysfunction, unspecified: Secondary | ICD-10-CM | POA: Diagnosis not present

## 2020-10-04 DIAGNOSIS — R69 Illness, unspecified: Secondary | ICD-10-CM | POA: Diagnosis not present

## 2020-10-04 DIAGNOSIS — I2510 Atherosclerotic heart disease of native coronary artery without angina pectoris: Secondary | ICD-10-CM | POA: Diagnosis not present

## 2020-10-10 ENCOUNTER — Telehealth: Payer: Self-pay | Admitting: *Deleted

## 2020-10-10 NOTE — Telephone Encounter (Signed)
RETURNED PATIENT'S PHONE CALL, SPOKE WITH PATIENT. ?

## 2020-10-13 DIAGNOSIS — Z79899 Other long term (current) drug therapy: Secondary | ICD-10-CM | POA: Diagnosis not present

## 2020-10-13 DIAGNOSIS — M353 Polymyalgia rheumatica: Secondary | ICD-10-CM | POA: Diagnosis not present

## 2020-10-16 DIAGNOSIS — C61 Malignant neoplasm of prostate: Secondary | ICD-10-CM | POA: Diagnosis not present

## 2020-10-17 ENCOUNTER — Telehealth: Payer: Self-pay | Admitting: *Deleted

## 2020-10-17 DIAGNOSIS — G7000 Myasthenia gravis without (acute) exacerbation: Secondary | ICD-10-CM | POA: Diagnosis not present

## 2020-10-17 DIAGNOSIS — R5383 Other fatigue: Secondary | ICD-10-CM | POA: Diagnosis not present

## 2020-10-17 DIAGNOSIS — C61 Malignant neoplasm of prostate: Secondary | ICD-10-CM | POA: Diagnosis not present

## 2020-10-17 DIAGNOSIS — I10 Essential (primary) hypertension: Secondary | ICD-10-CM | POA: Diagnosis not present

## 2020-10-17 DIAGNOSIS — M353 Polymyalgia rheumatica: Secondary | ICD-10-CM | POA: Diagnosis not present

## 2020-10-17 DIAGNOSIS — E039 Hypothyroidism, unspecified: Secondary | ICD-10-CM | POA: Diagnosis not present

## 2020-10-17 DIAGNOSIS — A770 Spotted fever due to Rickettsia rickettsii: Secondary | ICD-10-CM | POA: Diagnosis not present

## 2020-10-17 DIAGNOSIS — E785 Hyperlipidemia, unspecified: Secondary | ICD-10-CM | POA: Diagnosis not present

## 2020-10-17 DIAGNOSIS — E663 Overweight: Secondary | ICD-10-CM | POA: Diagnosis not present

## 2020-10-17 DIAGNOSIS — Z13820 Encounter for screening for osteoporosis: Secondary | ICD-10-CM | POA: Diagnosis not present

## 2020-10-17 NOTE — Telephone Encounter (Signed)
CALLED PATIENT TO REMIND OF LAB APPT. FOR 10-24-20, AND HIS PROCEDURE FOR 10-28-20, SPOKE WITH PATIENT AND HE IS AWARE OF THIS APPT. AND PROCEDURE.

## 2020-10-21 ENCOUNTER — Other Ambulatory Visit: Payer: Self-pay

## 2020-10-21 ENCOUNTER — Encounter (HOSPITAL_BASED_OUTPATIENT_CLINIC_OR_DEPARTMENT_OTHER): Payer: Self-pay | Admitting: Urology

## 2020-10-21 NOTE — Progress Notes (Signed)
Spoke w/ via phone for pre-op interview--- pt Lab needs dos----  no             Lab results------ pt getting lab work done 10-24-2020 CBC/ CMP/ PT/ PTT;  current ekg/ cxr in epic/ chart COVID test -----patient states asymptomatic no test needed Arrive at ------- 0900 on 10-28-2020 NPO after MN NO Solid Food.  Clear liquids from MN until--- 0800 Med rec completed Medications to take morning of surgery ----- synthroid, prednisone Diabetic medication ----- n/a Patient instructed no nail polish to be worn day of surgery Patient instructed to bring photo id and insurance card day of surgery Patient aware to have Driver (ride ) / caregiver for 24 hours after surgery -- wife, Arthur Hart Patient Special Instructions ----- will do one fleet enema morning of surgery Pre-Op special Istructions ----- n/a Patient verbalized understanding of instructions that were given at this phone interview. Patient denies shortness of breath, chest pain, fever, cough at this phone interview.

## 2020-10-24 ENCOUNTER — Other Ambulatory Visit: Payer: Self-pay

## 2020-10-24 ENCOUNTER — Encounter (HOSPITAL_COMMUNITY)
Admission: RE | Admit: 2020-10-24 | Discharge: 2020-10-24 | Disposition: A | Discharge status: Home / Self Care | Payer: Medicare HMO | Source: Ambulatory Visit | Attending: Urology | Admitting: Urology

## 2020-10-24 DIAGNOSIS — Z01812 Encounter for preprocedural laboratory examination: Secondary | ICD-10-CM | POA: Insufficient documentation

## 2020-10-24 LAB — COMPREHENSIVE METABOLIC PANEL
ALT: 23 U/L (ref 0–44)
AST: 29 U/L (ref 15–41)
Albumin: 4.1 g/dL (ref 3.5–5.0)
Alkaline Phosphatase: 56 U/L (ref 38–126)
Anion gap: 7 (ref 5–15)
BUN: 30 mg/dL — ABNORMAL HIGH (ref 8–23)
CO2: 25 mmol/L (ref 22–32)
Calcium: 9.3 mg/dL (ref 8.9–10.3)
Chloride: 106 mmol/L (ref 98–111)
Creatinine, Ser: 1.1 mg/dL (ref 0.61–1.24)
GFR, Estimated: >60 mL/min (ref >60)
Glucose, Bld: 97 mg/dL (ref 70–99)
Potassium: 4.3 mmol/L (ref 3.5–5.1)
Sodium: 138 mmol/L (ref 135–145)
Total Bilirubin: 1 mg/dL (ref 0.3–1.2)
Total Protein: 6.8 g/dL (ref 6.5–8.1)

## 2020-10-24 LAB — CBC
HCT: 37.3 % — ABNORMAL LOW (ref 39.0–52.0)
Hemoglobin: 12.3 g/dL — ABNORMAL LOW (ref 13.0–17.0)
MCH: 33.2 pg (ref 26.0–34.0)
MCHC: 33 g/dL (ref 30.0–36.0)
MCV: 100.8 fL — ABNORMAL HIGH (ref 80.0–100.0)
Platelets: 239 10*3/uL (ref 150–400)
RBC: 3.7 MIL/uL — ABNORMAL LOW (ref 4.22–5.81)
RDW: 13.2 % (ref 11.5–15.5)
WBC: 14.8 10*3/uL — ABNORMAL HIGH (ref 4.0–10.5)
nRBC: 0 % (ref 0.0–0.2)

## 2020-10-24 LAB — PROTIME-INR
INR: 1 (ref 0.8–1.2)
Prothrombin Time: 12.7 seconds (ref 11.4–15.2)

## 2020-10-24 LAB — APTT: aPTT: 29 seconds (ref 24–36)

## 2020-10-24 NOTE — Progress Notes (Signed)
Routed abnormal CBC done today , to Dr Claudia Desanctis in epic,.

## 2020-10-27 NOTE — H&P (Signed)
CC/HPI: cc: prostate cancer   07/31/20: 70 yo man with an elevated PSA of 4.7 in 12/2019 by his primary care physician, Dr. Manuella Ghazi. A repeat PSA was further elevated at 5.5 on 01/04/2020. He then saw Dr. Diona Fanti on 03/18/20, digital rectal examination was performed at that time revealing no nodules or concerning findings. A repeat PSA that same day remained stable elevated at 5.3. The prostate volume measured 15.5 cc. TRUS prostate biopsy 04/08/20 showed 4/12 positive biopsies: 2/12 Gleason 3+4 and 2/12 Gleason 3+3. He had a virtual consultation with Dr. [name]. He does have LUTs with nocturia and weak stream.   cT1c  Pre-treatment PSA 5.3  Gleason 3+4=7 favorable intermediate risk prostate cancer   10/16/2020: 70 year old male with a past medical history of polymyalgia rheumatica, myasthenia gravis, and Gleason 3+4=7 favorable intermediate risk prostate cancer presents today for preoperative appointment prior to undergoing brachy seed therapy placement as well as SpaceOAR. He does have an [place] by it id white blood count but has been on prednisone for a full year. He has multiple questions today which were answered to the best my ability.     ALLERGIES: None   MEDICATIONS: Tamsulosin Hcl 0.4 mg capsule 1 capsule PO Q HS  Alprazolam 0.5 mg tablet  Amlodipine Besylate 5 mg tablet  Centrum Silver  Lisinopril-Hydrochlorothiazide 20 mg-12.5 mg tablet  Prednisone 5 mg tablet  Sildenafil Citrate 20 mg tablet  Synthroid 150 mcg tablet  Temazepam 30 mg capsule  Vitamin C     GU PSH: None     PSH Notes: Thymectomy 1967   NON-GU PSH: None   GU PMH: Nocturia - 07/31/2020 Prostate Cancer - 07/31/2020 Weak Urinary Stream - 07/31/2020      PMH Notes: Pt is not sure if he has hyper or hypothyroidism; Myasthenia Gravis   NON-GU PMH: Anxiety Arthritis Cardiac murmur, unspecified Depression Heart disease, unspecified Hypercholesterolemia Hypertension Hyperthyroidism Hypothyroidism Sleep Apnea     FAMILY HISTORY: 2 daughters - Daughter Blood In Urine - Father Prostate Cancer - Father   SOCIAL HISTORY: Marital Status: Married Current Smoking Status: Patient does not smoke anymore.   Tobacco Use Assessment Completed: Used Tobacco in last 30 days? Does not use smokeless tobacco. Does drink.  Drinks 3 caffeinated drinks per day.    REVIEW OF SYSTEMS:    GU Review Male:   Patient reports frequent urination, get up at night to urinate, and trouble starting your stream. Patient denies hard to postpone urination, burning/ pain with urination, leakage of urine, stream starts and stops, have to strain to urinate , erection problems, and penile pain.  Gastrointestinal (Upper):   Patient reports indigestion/ heartburn. Patient denies nausea and vomiting.  Gastrointestinal (Lower):   Patient denies diarrhea and constipation.  Constitutional:   Patient reports fatigue. Patient denies fever, night sweats, and weight loss.  Skin:   Patient denies skin rash/ lesion and itching.  Eyes:   Patient reports blurred vision. Patient denies double vision.  Ears/ Nose/ Throat:   Patient denies sore throat and sinus problems.  Hematologic/Lymphatic:   Patient denies swollen glands and easy bruising.  Cardiovascular:   Patient denies leg swelling and chest pains.  Respiratory:   Patient denies cough and shortness of breath.  Endocrine:   Patient denies excessive thirst.  Musculoskeletal:   Patient reports back pain and joint pain.   Neurological:   Patient reports headaches and dizziness.   Psychologic:   Patient denies depression and anxiety.   VITAL SIGNS:  10/16/2020 12:44 PM  Weight 171 lb / 77.56 kg  Height 62 in / 157.48 cm  BP 143/67 mmHg  Pulse 60 /min  Temperature 97.7 F / 36.5 C  BMI 31.3 kg/m   MULTI-SYSTEM PHYSICAL EXAMINATION:    Constitutional: Well-nourished. No physical deformities. Normally developed. Good grooming.  Respiratory: Normal breath sounds. No labored  breathing, no use of accessory muscles.   Cardiovascular: Regular rate and rhythm. No murmur, no gallop. Normal temperature, normal extremity pulses, no swelling, no varicosities.   Skin: No paleness, no jaundice, no cyanosis. No lesion, no ulcer, no rash. He has bruising noted bilaterally to his arms.  Neurologic / Psychiatric: Oriented to time, oriented to place, oriented to person. No depression, no anxiety, no agitation.  Gastrointestinal: No mass, no tenderness, no rigidity, non obese abdomen.     Complexity of Data:  Source Of History:  Patient  Records Review:   Previous Doctor Records, Previous Patient Records  Urine Test Review:   Urinalysis   07/31/20  PSA  Total PSA 4.23 ng/mL    10/16/20  Urinalysis  Urine Appearance Clear   Urine Color Yellow   Urine Glucose Neg mg/dL  Urine Bilirubin Neg mg/dL  Urine Ketones Neg mg/dL  Urine Specific Gravity 1.020   Urine Blood Neg ery/uL  Urine pH <=5.0   Urine Protein Neg mg/dL  Urine Urobilinogen 0.2 mg/dL  Urine Nitrites Neg   Urine Leukocyte Esterase Neg leu/uL   PROCEDURES:          Urinalysis Dipstick Dipstick Cont'd  Color: Yellow Bilirubin: Neg mg/dL  Appearance: Clear Ketones: Neg mg/dL  Specific Gravity: 1.020 Blood: Neg ery/uL  pH: <=5.0 Protein: Neg mg/dL  Glucose: Neg mg/dL Urobilinogen: 0.2 mg/dL    Nitrites: Neg    Leukocyte Esterase: Neg leu/uL    ASSESSMENT:      ICD-10 Details  1 GU:   Prostate Cancer - C61 Chronic, Stable   PLAN:           Document Letter(s):  Created for Patient: Clinical Summary         Notes:   Urinalysis is clear today. At this time, I did advise he stop all vitamins and minerals. I also advised he use a Fleet's enema prior to his procedure. We discussed expectations moving forward as well as follow-up moving forward. I advised he refrain from heavy lifting, riding a lawnmower, prolonged sitting, bicycle riding or motorcycle riding for 6 weeks after his procedure. All of his  questions were answered to the best of my ability. He will keep his upcoming surgery scheduled for 10/28/2020.

## 2020-10-28 ENCOUNTER — Ambulatory Visit (HOSPITAL_BASED_OUTPATIENT_CLINIC_OR_DEPARTMENT_OTHER): Payer: Medicare HMO | Admitting: Anesthesiology

## 2020-10-28 ENCOUNTER — Ambulatory Visit (HOSPITAL_COMMUNITY): Payer: Medicare HMO

## 2020-10-28 ENCOUNTER — Other Ambulatory Visit: Payer: Self-pay

## 2020-10-28 ENCOUNTER — Encounter (HOSPITAL_BASED_OUTPATIENT_CLINIC_OR_DEPARTMENT_OTHER)
Admission: RE | Disposition: A | Discharge status: Home / Self Care | Payer: Self-pay | Source: Ambulatory Visit | Attending: Urology

## 2020-10-28 ENCOUNTER — Ambulatory Visit (HOSPITAL_BASED_OUTPATIENT_CLINIC_OR_DEPARTMENT_OTHER)
Admission: RE | Admit: 2020-10-28 | Discharge: 2020-10-28 | Disposition: A | Discharge status: Home / Self Care | Payer: Medicare HMO | Source: Ambulatory Visit | Attending: Urology | Admitting: Urology

## 2020-10-28 ENCOUNTER — Encounter (HOSPITAL_BASED_OUTPATIENT_CLINIC_OR_DEPARTMENT_OTHER): Payer: Self-pay | Admitting: Urology

## 2020-10-28 DIAGNOSIS — E039 Hypothyroidism, unspecified: Secondary | ICD-10-CM | POA: Diagnosis not present

## 2020-10-28 DIAGNOSIS — I2510 Atherosclerotic heart disease of native coronary artery without angina pectoris: Secondary | ICD-10-CM | POA: Diagnosis not present

## 2020-10-28 DIAGNOSIS — C61 Malignant neoplasm of prostate: Secondary | ICD-10-CM | POA: Diagnosis not present

## 2020-10-28 DIAGNOSIS — Z6830 Body mass index (BMI) 30.0-30.9, adult: Secondary | ICD-10-CM | POA: Insufficient documentation

## 2020-10-28 DIAGNOSIS — E669 Obesity, unspecified: Secondary | ICD-10-CM | POA: Diagnosis not present

## 2020-10-28 DIAGNOSIS — Z87891 Personal history of nicotine dependence: Secondary | ICD-10-CM | POA: Diagnosis not present

## 2020-10-28 DIAGNOSIS — G7000 Myasthenia gravis without (acute) exacerbation: Secondary | ICD-10-CM | POA: Diagnosis not present

## 2020-10-28 DIAGNOSIS — M353 Polymyalgia rheumatica: Secondary | ICD-10-CM | POA: Insufficient documentation

## 2020-10-28 DIAGNOSIS — Z79899 Other long term (current) drug therapy: Secondary | ICD-10-CM | POA: Insufficient documentation

## 2020-10-28 DIAGNOSIS — Z7952 Long term (current) use of systemic steroids: Secondary | ICD-10-CM | POA: Diagnosis not present

## 2020-10-28 DIAGNOSIS — E785 Hyperlipidemia, unspecified: Secondary | ICD-10-CM | POA: Diagnosis not present

## 2020-10-28 DIAGNOSIS — I10 Essential (primary) hypertension: Secondary | ICD-10-CM | POA: Insufficient documentation

## 2020-10-28 DIAGNOSIS — Z8616 Personal history of COVID-19: Secondary | ICD-10-CM | POA: Insufficient documentation

## 2020-10-28 HISTORY — DX: Rheumatoid arthritis, unspecified: M06.9

## 2020-10-28 HISTORY — DX: Anemia, unspecified: D64.9

## 2020-10-28 HISTORY — DX: Unspecified symptoms and signs involving the genitourinary system: R39.9

## 2020-10-28 HISTORY — DX: Presence of spectacles and contact lenses: Z97.3

## 2020-10-28 HISTORY — DX: Gastro-esophageal reflux disease without esophagitis: K21.9

## 2020-10-28 HISTORY — DX: Atherosclerotic heart disease of native coronary artery without angina pectoris: I25.10

## 2020-10-28 HISTORY — DX: Personal history of other diseases of the circulatory system: Z86.79

## 2020-10-28 HISTORY — DX: Personal history of urinary calculi: Z87.442

## 2020-10-28 HISTORY — DX: Hyperlipidemia, unspecified: E78.5

## 2020-10-28 HISTORY — PX: SPACE OAR INSTILLATION: SHX6769

## 2020-10-28 HISTORY — DX: Hypothyroidism, unspecified: E03.9

## 2020-10-28 HISTORY — PX: RADIOACTIVE SEED IMPLANT: SHX5150

## 2020-10-28 SURGERY — INSERTION, RADIATION SOURCE, PROSTATE
Anesthesia: General | Site: Rectum

## 2020-10-28 MED ORDER — PROPOFOL 10 MG/ML IV BOLUS
INTRAVENOUS | Status: AC
  Filled 2020-10-28: qty 20

## 2020-10-28 MED ORDER — FENTANYL CITRATE (PF) 100 MCG/2ML IJ SOLN
INTRAMUSCULAR | Status: AC
  Filled 2020-10-28: qty 2

## 2020-10-28 MED ORDER — CEFAZOLIN SODIUM-DEXTROSE 2-4 GM/100ML-% IV SOLN
INTRAVENOUS | Status: AC
  Filled 2020-10-28: qty 100

## 2020-10-28 MED ORDER — GLYCOPYRROLATE 0.2 MG/ML IJ SOLN
INTRAMUSCULAR | Status: DC | PRN
  Administered 2020-10-28: .2 mg via INTRAVENOUS

## 2020-10-28 MED ORDER — PROPOFOL 10 MG/ML IV BOLUS
INTRAVENOUS | Status: DC | PRN
  Administered 2020-10-28: 50 mg via INTRAVENOUS
  Administered 2020-10-28: 100 mg via INTRAVENOUS
  Administered 2020-10-28: 150 mg via INTRAVENOUS

## 2020-10-28 MED ORDER — ONDANSETRON HCL 4 MG/2ML IJ SOLN
INTRAMUSCULAR | Status: AC
  Filled 2020-10-28: qty 2

## 2020-10-28 MED ORDER — LIDOCAINE 2% (20 MG/ML) 5 ML SYRINGE
INTRAMUSCULAR | Status: DC | PRN
  Administered 2020-10-28: 60 mg via INTRAVENOUS

## 2020-10-28 MED ORDER — FENTANYL CITRATE (PF) 100 MCG/2ML IJ SOLN
INTRAMUSCULAR | Status: DC | PRN
  Administered 2020-10-28 (×3): 25 ug via INTRAVENOUS
  Administered 2020-10-28: 50 ug via INTRAVENOUS

## 2020-10-28 MED ORDER — MIDAZOLAM HCL 2 MG/2ML IJ SOLN
INTRAMUSCULAR | Status: AC
  Filled 2020-10-28: qty 2

## 2020-10-28 MED ORDER — LACTATED RINGERS IV SOLN
INTRAVENOUS | Status: DC
  Administered 2020-10-28: 1000 mL via INTRAVENOUS

## 2020-10-28 MED ORDER — OXYCODONE HCL 5 MG/5ML PO SOLN
5.0000 mg | Freq: Once | ORAL | Status: DC | PRN
Start: 2020-10-28 — End: 2020-10-28

## 2020-10-28 MED ORDER — LEVOTHYROXINE SODIUM 75 MCG PO TABS
175.0000 ug | ORAL_TABLET | Freq: Once | ORAL | Status: AC
  Administered 2020-10-28: 175 ug via ORAL
  Filled 2020-10-28: qty 1

## 2020-10-28 MED ORDER — CEFAZOLIN SODIUM-DEXTROSE 2-4 GM/100ML-% IV SOLN
2.0000 g | Freq: Once | INTRAVENOUS | Status: AC
  Administered 2020-10-28: 2 g via INTRAVENOUS

## 2020-10-28 MED ORDER — LIDOCAINE HCL (PF) 2 % IJ SOLN
INTRAMUSCULAR | Status: AC
  Filled 2020-10-28: qty 5

## 2020-10-28 MED ORDER — FENTANYL CITRATE (PF) 100 MCG/2ML IJ SOLN: 25.0000 ug | INTRAMUSCULAR | Status: DC | PRN

## 2020-10-28 MED ORDER — IOHEXOL 300 MG/ML  SOLN
INTRAMUSCULAR | Status: DC | PRN
  Administered 2020-10-28: 7 mL

## 2020-10-28 MED ORDER — EPHEDRINE 5 MG/ML INJ
INTRAVENOUS | Status: AC
  Filled 2020-10-28: qty 5

## 2020-10-28 MED ORDER — ONDANSETRON HCL 4 MG/2ML IJ SOLN: 4.0000 mg | Freq: Once | INTRAMUSCULAR | Status: DC | PRN

## 2020-10-28 MED ORDER — MIDAZOLAM HCL 5 MG/5ML IJ SOLN
INTRAMUSCULAR | Status: DC | PRN
  Administered 2020-10-28: 2 mg via INTRAVENOUS

## 2020-10-28 MED ORDER — TRAMADOL HCL 50 MG PO TABS: 50.0000 mg | ORAL_TABLET | Freq: Four times a day (QID) | ORAL | 0 refills | Status: AC | PRN

## 2020-10-28 MED ORDER — DEXAMETHASONE SODIUM PHOSPHATE 10 MG/ML IJ SOLN
INTRAMUSCULAR | Status: DC | PRN
  Administered 2020-10-28: 5 mg via INTRAVENOUS

## 2020-10-28 MED ORDER — GLYCOPYRROLATE PF 0.2 MG/ML IJ SOSY
PREFILLED_SYRINGE | INTRAMUSCULAR | Status: AC
  Filled 2020-10-28: qty 1

## 2020-10-28 MED ORDER — OXYCODONE HCL 5 MG PO TABS: 5.0000 mg | ORAL_TABLET | Freq: Once | ORAL | Status: DC | PRN

## 2020-10-28 MED ORDER — SODIUM CHLORIDE 0.9 % IR SOLN
Status: DC | PRN
  Administered 2020-10-28: 1000 mL via INTRAVESICAL

## 2020-10-28 MED ORDER — FLEET ENEMA 7-19 GM/118ML RE ENEM: 1.0000 | ENEMA | Freq: Once | RECTAL | Status: DC

## 2020-10-28 MED ORDER — LEVOTHYROXINE SODIUM 175 MCG PO TABS
175.0000 ug | ORAL_TABLET | Freq: Once | ORAL | Status: DC
  Filled 2020-10-28: qty 1

## 2020-10-28 MED ORDER — DEXAMETHASONE SODIUM PHOSPHATE 10 MG/ML IJ SOLN
INTRAMUSCULAR | Status: AC
  Filled 2020-10-28: qty 1

## 2020-10-28 MED ORDER — EPHEDRINE SULFATE-NACL 50-0.9 MG/10ML-% IV SOSY
PREFILLED_SYRINGE | INTRAVENOUS | Status: DC | PRN
  Administered 2020-10-28 (×5): 5 mg via INTRAVENOUS

## 2020-10-28 MED ORDER — ONDANSETRON HCL 4 MG/2ML IJ SOLN
INTRAMUSCULAR | Status: DC | PRN
  Administered 2020-10-28: 4 mg via INTRAVENOUS

## 2020-10-28 MED ORDER — SODIUM CHLORIDE (PF) 0.9 % IJ SOLN
INTRAMUSCULAR | Status: DC | PRN
  Administered 2020-10-28: 10 mL

## 2020-10-28 SURGICAL SUPPLY — 37 items
AGX100 ×1 IMPLANT
BAG DRN RND TRDRP ANRFLXCHMBR (UROLOGICAL SUPPLIES) ×2
BAG URINE DRAIN 2000ML AR STRL (UROLOGICAL SUPPLIES) ×3 IMPLANT
BLADE CLIPPER SENSICLIP SURGIC (BLADE) ×3 IMPLANT
CATH FOLEY 2WAY SLVR  5CC 16FR (CATHETERS) ×3
CATH FOLEY 2WAY SLVR 5CC 16FR (CATHETERS) ×2 IMPLANT
CATH ROBINSON RED A/P 16FR (CATHETERS) IMPLANT
CATH ROBINSON RED A/P 20FR (CATHETERS) ×3 IMPLANT
CLOTH BEACON ORANGE TIMEOUT ST (SAFETY) ×3 IMPLANT
CNTNR URN SCR LID CUP LEK RST (MISCELLANEOUS) ×4 IMPLANT
CONT SPEC 4OZ STRL OR WHT (MISCELLANEOUS) ×6
COVER BACK TABLE 60X90IN (DRAPES) ×3 IMPLANT
COVER MAYO STAND STRL (DRAPES) ×3 IMPLANT
DRSG TEGADERM 4X4.75 (GAUZE/BANDAGES/DRESSINGS) ×3 IMPLANT
DRSG TEGADERM 8X12 (GAUZE/BANDAGES/DRESSINGS) ×3 IMPLANT
GAUZE SPONGE 2X2 12PLY NS (GAUZE/BANDAGES/DRESSINGS) ×1 IMPLANT
GLOVE SURG ENC MOIS LTX SZ6.5 (GLOVE) ×9 IMPLANT
GLOVE SURG ENC MOIS LTX SZ7.5 (GLOVE) IMPLANT
GLOVE SURG ENC MOIS LTX SZ8 (GLOVE) ×6 IMPLANT
GLOVE SURG ORTHO LTX SZ8.5 (GLOVE) IMPLANT
GLOVE SURG UNDER LTX SZ6.5 (GLOVE) ×3 IMPLANT
GLOVE SURG UNDER POLY LF SZ6.5 (GLOVE) IMPLANT
GOWN STRL REUS W/ TWL LRG LVL3 (GOWN DISPOSABLE) IMPLANT
GOWN STRL REUS W/TWL LRG LVL3 (GOWN DISPOSABLE) ×6 IMPLANT
HOLDER FOLEY CATH W/STRAP (MISCELLANEOUS) ×3 IMPLANT
IMPL SPACEOAR VUE SYSTEM (Spacer) IMPLANT
IMPLANT SPACEOAR VUE SYSTEM (Spacer) ×3 IMPLANT
IV NS 1000ML (IV SOLUTION) ×3
IV NS 1000ML BAXH (IV SOLUTION) ×2 IMPLANT
KIT TURNOVER CYSTO (KITS) ×3 IMPLANT
MARKER SKIN DUAL TIP RULER LAB (MISCELLANEOUS) ×3 IMPLANT
PACK CYSTO (CUSTOM PROCEDURE TRAY) ×3 IMPLANT
SUT BONE WAX W31G (SUTURE) IMPLANT
SYR 10ML LL (SYRINGE) ×3 IMPLANT
TOWEL OR 17X26 10 PK STRL BLUE (TOWEL DISPOSABLE) ×3 IMPLANT
UNDERPAD 30X36 HEAVY ABSORB (UNDERPADS AND DIAPERS) ×6 IMPLANT
WATER STERILE IRR 500ML POUR (IV SOLUTION) ×3 IMPLANT

## 2020-10-28 NOTE — Anesthesia Preprocedure Evaluation (Addendum)
Anesthesia Evaluation  Patient identified by MRN, date of birth, ID band Patient awake    Reviewed: Allergy & Precautions, NPO status , Patient's Chart, lab work & pertinent test results  History of Anesthesia Complications Negative for: history of anesthetic complications  Airway Mallampati: III  TM Distance: >3 FB Neck ROM: Limited    Dental  (+) Dental Advisory Given   Pulmonary neg pulmonary ROS,    Pulmonary exam normal        Cardiovascular hypertension, Pt. on medications + CAD  Normal cardiovascular exam     Neuro/Psych  Myasthenia gravis s/p thymectomy with resolution of symptoms  negative psych ROS   GI/Hepatic Neg liver ROS, GERD  Controlled and Medicated,  Endo/Other  Hypothyroidism  Obesity   Renal/GU negative Renal ROS    Prostate cancer     Musculoskeletal  (+) Arthritis  (PMR),   Abdominal   Peds  Hematology  (+) anemia ,   Anesthesia Other Findings   Reproductive/Obstetrics                            Anesthesia Physical Anesthesia Plan  ASA: 3  Anesthesia Plan: General   Post-op Pain Management:    Induction: Intravenous  PONV Risk Score and Plan: 2 and Treatment may vary due to age or medical condition and Ondansetron  Airway Management Planned: LMA  Additional Equipment: None  Intra-op Plan:   Post-operative Plan: Extubation in OR  Informed Consent: I have reviewed the patients History and Physical, chart, labs and discussed the procedure including the risks, benefits and alternatives for the proposed anesthesia with the patient or authorized representative who has indicated his/her understanding and acceptance.     Dental advisory given  Plan Discussed with: CRNA and Anesthesiologist  Anesthesia Plan Comments:        Anesthesia Quick Evaluation

## 2020-10-28 NOTE — Progress Notes (Signed)
  Radiation Oncology         [phone] ________________________________  Name: CAYDYN SPRUNG MRN: [id number]  Date: 10/28/2020  DOB: 10-15-1950       Prostate Seed Implant  HE:NIDP, [name], MD  No ref. provider found  DIAGNOSIS: 70 y.o. gentleman with Stage T1c adenocarcinoma of the prostate with Gleason score of 3+4, and PSA of 5.3.  Oncology History  Malignant neoplasm of prostate ([place])  04/08/2020 Cancer Staging   Staging form: Prostate, AJCC 8th Edition - Clinical stage from 04/08/2020: Stage IIB (cT1c, cN0, cM0, PSA: 5.3, Grade Group: 2) - Signed by Freeman Caldron, PA-C on 05/07/2020 Histopathologic type: Adenocarcinoma, NOS Stage prefix: Initial diagnosis Prostate specific antigen (PSA) range: Less than 10 Gleason primary pattern: 3 Gleason secondary pattern: 4 Gleason score: 7 Histologic grading system: 5 grade system Number of biopsy cores examined: 12 Number of biopsy cores positive: 4 Location of positive needle core biopsies: Both sides   05/07/2020 Initial Diagnosis   Malignant neoplasm of prostate ([place])     No diagnosis found.  PROCEDURE: Insertion of radioactive I-125 seeds into the prostate gland.  RADIATION DOSE: 145 Gy, definitive therapy.  TECHNIQUE: NYQUAN SELBE was brought to the operating room with the urologist. He was placed in the dorsolithotomy position. He was catheterized and a rectal tube was inserted. The perineum was shaved, prepped and draped. The ultrasound probe was then introduced into the rectum to see the prostate gland.  TREATMENT DEVICE: A needle grid was attached to the ultrasound probe stand and anchor needles were placed.  3D PLANNING: The prostate was imaged in 3D using a sagittal sweep of the prostate probe. These images were transferred to the planning computer. There, the prostate, urethra and rectum were defined on each axial reconstructed image. Then, the software created an optimized 3D plan and a few seed positions were adjusted.  The quality of the plan was reviewed using [facility] information for the target and the following two organs at risk:  Urethra and Rectum.  Then the accepted plan was printed and handed off to the radiation therapist.  Under my supervision, the custom loading of the seeds and spacers was carried out and loaded into sealed vicryl sleeves.  These pre-loaded needles were then placed into the needle holder.Marland Kitchen  PROSTATE VOLUME STUDY:  Using transrectal ultrasound the volume of the prostate was verified to be 19.5 cc.  SPECIAL TREATMENT PROCEDURE/SUPERVISION AND HANDLING: The pre-loaded needles were then delivered under sagittal guidance. A total of 17 needles were used to deposit 70 seeds in the prostate gland. The individual seed activity was 0.261 mCi.  SpaceOAR:  Yes  COMPLEX SIMULATION: At the end of the procedure, an anterior radiograph of the pelvis was obtained to document seed positioning and count. Cystoscopy was performed to check the urethra and bladder.  MICRODOSIMETRY: At the end of the procedure, the patient was emitting 0.088 mR/hr at 1 meter. Accordingly, he was considered safe for hospital discharge.  PLAN: The patient will return to the radiation oncology clinic for post implant CT dosimetry in three weeks.   ________________________________  [name] [name], M.D.

## 2020-10-28 NOTE — Interval H&P Note (Signed)
History and Physical Interval Note:  10/28/2020 10:15 AM  Arthur Hart  has presented today for surgery, with the diagnosis of PROSTATE CANCER.  The various methods of treatment have been discussed with the patient and family. After consideration of risks, benefits and other options for treatment, the patient has consented to  Procedure(s): RADIOACTIVE SEED IMPLANT/BRACHYTHERAPY IMPLANT (N/A) SPACE OAR INSTILLATION (N/A) as a surgical intervention.  The patient's history has been reviewed, patient examined, no change in status, stable for surgery.  I have reviewed the patient's chart and labs.  Questions were answered to the patient's satisfaction.     Sherif Millspaugh D Cydnee Fuquay

## 2020-10-28 NOTE — Discharge Instructions (Addendum)

## 2020-10-28 NOTE — Anesthesia Postprocedure Evaluation (Signed)
Anesthesia Post Note  Patient: Arthur Hart  Procedure(s) Performed: RADIOACTIVE SEED IMPLANT/BRACHYTHERAPY IMPLANT (Prostate) SPACE OAR INSTILLATION (Rectum)     Patient location during evaluation: PACU Anesthesia Type: General Level of consciousness: awake and alert Pain management: pain level controlled Vital Signs Assessment: post-procedure vital signs reviewed and stable Respiratory status: spontaneous breathing, nonlabored ventilation and respiratory function stable Cardiovascular status: stable and blood pressure returned to baseline Anesthetic complications: no   No notable events documented.  Last Vitals:  Vitals:   10/28/20 1315 10/28/20 1400  BP: 135/64 (!) 146/62  Pulse: 63 (!) 55  Resp: 12 16  Temp:  (!) 36.4 C  SpO2: 99% 100%    Last Pain:  Vitals:   10/28/20 1400  TempSrc:   PainSc: 0-No pain                 Audry Pili

## 2020-10-28 NOTE — Transfer of Care (Signed)
Immediate Anesthesia Transfer of Care Note  Patient: Arthur Hart  Procedure(s) Performed: RADIOACTIVE SEED IMPLANT/BRACHYTHERAPY IMPLANT (Prostate) SPACE OAR INSTILLATION (Rectum)  Patient Location: PACU  Anesthesia Type:General  Level of Consciousness: drowsy and patient cooperative  Airway & Oxygen Therapy: Patient Spontanous Breathing  Post-op Assessment: Report given to RN and Post -op Vital signs reviewed and stable  Post vital signs: Reviewed and stable  Last Vitals:  Vitals Value Taken Time  BP 144/66 10/28/20 1249  Temp    Pulse 72 10/28/20 1252  Resp 18 10/28/20 1252  SpO2 98 % 10/28/20 1252  Vitals shown include unvalidated device data.  Last Pain:  Vitals:   10/28/20 0926  TempSrc:   PainSc: 3       Patients Stated Pain Goal: 8 (40/37/54 3606)  Complications: No notable events documented.

## 2020-10-28 NOTE — Op Note (Signed)
PATIENT:  Arthur Hart  PRE-OPERATIVE DIAGNOSIS:  Adenocarcinoma of the prostate  POST-OPERATIVE DIAGNOSIS:  Same  PROCEDURE:  1. I-125 radioactive seed implantation 2. Cystoscopy  3. Placement of SpaceOAR 4. Fluoroscopy use with time less than 1 hour.  SURGEON:  Jacalyn Lefevre, MD  Radiation oncologist: Dr. Tyler Pita  ANESTHESIA:  General  EBL:  Minimal  DRAINS: None  INDICATION: Arthur Hart with prostate cancer here for brachytherapy and Space OAR.   Description of procedure: After informed consent the patient was brought to the major OR, placed on the table and administered general anesthesia. He was then moved to the modified lithotomy position with his perineum perpendicular to the floor. His perineum and genitalia were then sterilely prepped. An official timeout was then performed. A 16 French Foley catheter was then placed in the bladder and filled with dilute contrast, a rectal tube was placed in the rectum and the transrectal ultrasound probe was placed in the rectum and affixed to the stand. He was then sterilely draped.  Real time ultrasonography was used along with the seed planning software [employer] Prostate vs. 4.2.2.4. This was used to develop the seed plan including the number of needles as well as number of seeds required for complete and adequate coverage.  The needles were then preloaded with seeds and spacers according to the previously developed plan.  Real-time ultrasonography was then used along with the previusly developed plan to implant a total of 70 seeds using 17 needles. This proceeded without difficulty or complication.   I then proceeded with placement of SpaceOAR by introducing a needle with the bevel angled inferiorly approximately 2 cm superior to the anus. This was angled downward and under direct ultrasound was placed within the space between the prostatic capsule and rectum. This was confirmed with a small amount of sterile saline injected and  this was performed under direct ultrasound. I then attached the SpaceOAR to the needle and injected this in the space between the prostate and rectum with good placement noted.  A Foley catheter was then removed as well as the transrectal ultrasound probe and rectal probe. Flexible cystoscopy was then performed using the 17 French flexible scope which revealed a normal urethra throughout its length down to the sphincter which appeared intact. The prostatic urethra revealed bilobar hypertrophy but no evidence of obstruction, seeds, spacers or lesions. The bladder was then entered and fully and systematically inspected. The ureteral orifices were noted to be of normal configuration and position. The mucosa revealed no evidence of tumors. There were also no stones identified within the bladder. I noted no seeds or spacers on the floor of the bladder and retroflexion of the scope revealed no seeds protruding from the base of the prostate.  C-arm fluoroscopy was then used to evaluate the distribution of seeds placement and aid in determining confirmation of the number of seeds placed.  Real-time fluoroscopy was used with saved images revealing the location of the seeds placed both in AP and oblique views.  The cystoscope was then removed and the patient was awakened and taken to recovery room in stable and satisfactory condition. He tolerated procedure well and there were no intraoperative complications.

## 2020-10-28 NOTE — Anesthesia Procedure Notes (Addendum)
Procedure Name: LMA Insertion Date/Time: 10/28/2020 11:29 AM Performed by: Gwyndolyn Saxon, CRNA Pre-anesthesia Checklist: Patient identified, Emergency Drugs available, Suction available and Patient being monitored Patient Re-evaluated:Patient Re-evaluated prior to induction Oxygen Delivery Method: Circle system utilized Preoxygenation: Pre-oxygenation with 100% oxygen Induction Type: IV induction Ventilation: Mask ventilation without difficulty LMA: LMA inserted LMA Size: 4.0 Number of attempts: 2 Placement Confirmation: positive ETCO2 and breath sounds checked- equal and bilateral Tube secured with: Tape Dental Injury: Teeth and Oropharynx as per pre-operative assessment

## 2020-10-29 ENCOUNTER — Encounter (HOSPITAL_BASED_OUTPATIENT_CLINIC_OR_DEPARTMENT_OTHER): Payer: Self-pay | Admitting: Urology

## 2020-11-05 ENCOUNTER — Telehealth: Payer: Self-pay | Admitting: *Deleted

## 2020-11-05 NOTE — Telephone Encounter (Signed)
RETURNED PATIENT'S PHONE CALL, SPOKE WITH PATIENT. ?

## 2020-11-10 DIAGNOSIS — Z299 Encounter for prophylactic measures, unspecified: Secondary | ICD-10-CM | POA: Diagnosis not present

## 2020-11-10 DIAGNOSIS — Z7189 Other specified counseling: Secondary | ICD-10-CM | POA: Diagnosis not present

## 2020-11-10 DIAGNOSIS — Z6827 Body mass index (BMI) 27.0-27.9, adult: Secondary | ICD-10-CM | POA: Diagnosis not present

## 2020-11-10 DIAGNOSIS — E7800 Pure hypercholesterolemia, unspecified: Secondary | ICD-10-CM | POA: Diagnosis not present

## 2020-11-10 DIAGNOSIS — E039 Hypothyroidism, unspecified: Secondary | ICD-10-CM | POA: Diagnosis not present

## 2020-11-10 DIAGNOSIS — R030 Elevated blood-pressure reading, without diagnosis of hypertension: Secondary | ICD-10-CM | POA: Diagnosis not present

## 2020-11-10 DIAGNOSIS — Z79899 Other long term (current) drug therapy: Secondary | ICD-10-CM | POA: Diagnosis not present

## 2020-11-10 DIAGNOSIS — Z1331 Encounter for screening for depression: Secondary | ICD-10-CM | POA: Diagnosis not present

## 2020-11-10 DIAGNOSIS — Z0000 Encounter for general adult medical examination without abnormal findings: Secondary | ICD-10-CM | POA: Diagnosis not present

## 2020-11-10 DIAGNOSIS — Z789 Other specified health status: Secondary | ICD-10-CM | POA: Diagnosis not present

## 2020-11-10 DIAGNOSIS — R52 Pain, unspecified: Secondary | ICD-10-CM | POA: Diagnosis not present

## 2020-11-10 DIAGNOSIS — Z1339 Encounter for screening examination for other mental health and behavioral disorders: Secondary | ICD-10-CM | POA: Diagnosis not present

## 2020-11-11 ENCOUNTER — Telehealth: Payer: Self-pay | Admitting: *Deleted

## 2020-11-11 DIAGNOSIS — R3912 Poor urinary stream: Secondary | ICD-10-CM | POA: Diagnosis not present

## 2020-11-11 NOTE — Telephone Encounter (Signed)
CALLED PATIENT TO REMIND OF POST SEED APPTS. FOR 11-12-20, SPOKE WITH PATIENT AND HE IS AWARE OF THESE APPTS.

## 2020-11-12 ENCOUNTER — Encounter: Payer: Self-pay | Admitting: Urology

## 2020-11-12 ENCOUNTER — Ambulatory Visit
Admission: RE | Admit: 2020-11-12 | Discharge: 2020-11-12 | Disposition: A | Discharge status: Home / Self Care | Payer: Medicare HMO | Source: Ambulatory Visit | Attending: Radiation Oncology | Admitting: Radiation Oncology

## 2020-11-12 ENCOUNTER — Ambulatory Visit
Admission: RE | Admit: 2020-11-12 | Discharge: 2020-11-12 | Disposition: A | Discharge status: Home / Self Care | Payer: Medicare HMO | Source: Ambulatory Visit | Attending: Urology | Admitting: Urology

## 2020-11-12 ENCOUNTER — Other Ambulatory Visit: Payer: Self-pay

## 2020-11-12 VITALS — BP 145/60 | HR 60 | Temp 97.9°F | Resp 20 | Ht 64.0 in | Wt 168.8 lb

## 2020-11-12 DIAGNOSIS — R3911 Hesitancy of micturition: Secondary | ICD-10-CM | POA: Diagnosis not present

## 2020-11-12 DIAGNOSIS — C61 Malignant neoplasm of prostate: Secondary | ICD-10-CM | POA: Insufficient documentation

## 2020-11-12 DIAGNOSIS — Z79899 Other long term (current) drug therapy: Secondary | ICD-10-CM | POA: Diagnosis not present

## 2020-11-12 DIAGNOSIS — R351 Nocturia: Secondary | ICD-10-CM | POA: Insufficient documentation

## 2020-11-12 DIAGNOSIS — Z923 Personal history of irradiation: Secondary | ICD-10-CM | POA: Diagnosis not present

## 2020-11-12 DIAGNOSIS — R3912 Poor urinary stream: Secondary | ICD-10-CM | POA: Diagnosis not present

## 2020-11-12 DIAGNOSIS — Z7982 Long term (current) use of aspirin: Secondary | ICD-10-CM | POA: Insufficient documentation

## 2020-11-12 DIAGNOSIS — R350 Frequency of micturition: Secondary | ICD-10-CM | POA: Insufficient documentation

## 2020-11-12 NOTE — Progress Notes (Signed)
  Radiation Oncology         [phone] ________________________________  Name: Arthur Hart MRN: [id number]  Date: 11/12/2020  DOB: [birth year]  COMPLEX SIMULATION NOTE  NARRATIVE:  The patient was brought to the [facility] today following prostate seed implantation approximately one month ago.  Identity was confirmed.  All relevant records and images related to the planned course of therapy were reviewed.  Then, the patient was set-up supine.  CT images were obtained.  The CT images were loaded into the planning software.  Then the prostate and rectum were contoured.  Treatment planning then occurred.  The implanted iodine 125 seeds were identified by the physics staff for projection of radiation distribution  I have requested : 3D Simulation  I have requested a DVH of the following structures: Prostate and rectum.    ________________________________  [name] [name], M.D.

## 2020-11-12 NOTE — Progress Notes (Signed)
Patient reports nocturia x4, fatigue, and erectile dysfunction.  Flomax 0.4mg  taken as directed. Urology follow-up at [facility] scheduled for January 2023.  Meaningful use complete.  BP (!) 145/60 (BP Location: Right Arm, Patient Position: Sitting, Cuff Size: Large)   Pulse 60   Temp 97.9 F (36.6 C)   Resp 20   Ht 5\' 4"  (1.626 m)   Wt 168 lb 12.8 oz (76.6 kg)   SpO2 100%   BMI 28.97 kg/m

## 2020-11-12 NOTE — Progress Notes (Signed)
Radiation Oncology         [phone] ________________________________  Name: Arthur Hart MRN: [id number]  Date: 11/12/2020  DOB: May 02, 1950  Post-Seed Follow-Up Visit Note  CC: Monico Blitz, MD  [name], MD  Diagnosis:   70 y.o. gentleman with Stage T1c adenocarcinoma of the prostate with Gleason score of 3+4, and PSA of 5.3.    ICD-10-CM   1. Malignant neoplasm of prostate ([facility])  C61       Interval Since Last Radiation:  2 weeks 10/28/20:  Insertion of radioactive I-125 seeds into the prostate gland; 145 Gy, definitive therapy with placement of SpaceOAR gel.  Narrative:  The patient returns today for routine follow-up.  He is complaining of increased urinary frequency and urinary hesitation symptoms. He filled out a questionnaire regarding urinary function today providing and overall IPSS score of 23 characterizing his symptoms as severe with nocturia x4, weak stream, hesitancy, intermittency, frequency, and urgency despite taking Flomax daily as prescribed. However, he does feel like things are gradually improving and he specifically denies dysuria, gross hematuria, straining to void or incontinence.  His pre-implant score was 14. He denies any abdominal pain or bowel symptoms. He has not noticed any significant impact on his energy level and overall, he is quite pleased with his progress to date.  ALLERGIES:  is allergic to beta adrenergic blockers.  Meds: Current Outpatient Medications  Medication Sig Dispense Refill   ALPRAZolam (XANAX) 0.5 MG tablet Take 0.5 mg by mouth at bedtime as needed.     amLODipine (NORVASC) 5 MG tablet Take 5 mg by mouth at bedtime.     Ascorbic Acid (VITAMIN C PO) Take by mouth daily.     aspirin 325 MG tablet Take 325 mg by mouth daily.     calcium carbonate (TUMS - DOSED IN MG ELEMENTAL CALCIUM) 500 MG chewable tablet Chew 1 tablet by mouth as needed for indigestion or heartburn.     levothyroxine (SYNTHROID, LEVOTHROID) 175 MCG  tablet Take 175 mcg by mouth daily.     lisinopril-hydrochlorothiazide (ZESTORETIC) 20-12.5 MG tablet Take 1 tablet by mouth daily.     Multiple Vitamins-Minerals (CENTRUM SILVER 50+MEN PO) Take 1 capsule by mouth daily.     predniSONE (DELTASONE) 5 MG tablet Take 5 mg by mouth 2 (two) times daily. Pt take two tablets in am and one tablet in evening     sildenafil (REVATIO) 20 MG tablet Take 20 mg by mouth as directed.     temazepam (RESTORIL) 30 MG capsule Take 30 mg by mouth at bedtime as needed for sleep.     traMADol (ULTRAM) 50 MG tablet Take 1 tablet (50 mg total) by mouth every 6 (six) hours as needed. 20 tablet 0   No current facility-administered medications for this visit.    Physical Findings: In general this is a well appearing Caucasian male in no acute distress. He's alert and oriented x4 and appropriate throughout the examination. Cardiopulmonary assessment is negative for acute distress and he exhibits normal effort.   Lab Findings: Lab Results  Component Value Date   WBC 14.8 (H) 10/24/2020   HGB 12.3 (L) 10/24/2020   HCT 37.3 (L) 10/24/2020   MCV 100.8 (H) 10/24/2020   PLT 239 10/24/2020    Radiographic Findings:  Patient underwent CT imaging in our clinic for post implant dosimetry. The CT will be reviewed by Dr. [name] to confirm there is an adequate distribution of radioactive seeds throughout the prostate gland and ensure  that there are no seeds in or near the rectum.  We suspect the final radiation plan and dosimetry will show appropriate coverage of the prostate gland. He understands that we will call and inform him of any unexpected findings on further review of his imaging and dosimetry.  Impression/Plan: The patient is recovering from the effects of radiation. His urinary symptoms should gradually improve over the next 4-6 months. We talked about this today. He is encouraged by his improvement already and is otherwise pleased with his outcome. We also talked  about long-term follow-up for prostate cancer following seed implant. He understands that ongoing PSA determinations and digital rectal exams will help perform surveillance to rule out disease recurrence. He has a follow up appointment scheduled with Dr. Claudia Desanctis in Jan. 2023. He understands what to expect with his PSA measures. Patient was also educated today about some of the long-term effects from radiation including a small risk for rectal bleeding and possibly erectile dysfunction. We talked about some of the general management approaches to these potential complications. However, I did encourage the patient to contact our office or return at any point if he has questions or concerns related to his previous radiation and prostate cancer.    Nicholos Johns, PA-C

## 2020-11-14 DIAGNOSIS — C61 Malignant neoplasm of prostate: Secondary | ICD-10-CM | POA: Diagnosis not present

## 2020-11-14 DIAGNOSIS — Z299 Encounter for prophylactic measures, unspecified: Secondary | ICD-10-CM | POA: Diagnosis not present

## 2020-11-14 DIAGNOSIS — I10 Essential (primary) hypertension: Secondary | ICD-10-CM | POA: Diagnosis not present

## 2020-11-14 DIAGNOSIS — Z6828 Body mass index (BMI) 28.0-28.9, adult: Secondary | ICD-10-CM | POA: Diagnosis not present

## 2020-11-14 DIAGNOSIS — D72829 Elevated white blood cell count, unspecified: Secondary | ICD-10-CM | POA: Diagnosis not present

## 2020-11-24 ENCOUNTER — Telehealth: Payer: Self-pay | Admitting: *Deleted

## 2020-11-25 ENCOUNTER — Telehealth: Payer: Self-pay | Admitting: *Deleted

## 2020-11-25 NOTE — Telephone Encounter (Signed)
Scheduled per sch msg. Called and left msg. Mailed printout  

## 2020-12-05 ENCOUNTER — Ambulatory Visit
Admission: RE | Admit: 2020-12-05 | Discharge: 2020-12-05 | Disposition: A | Discharge status: Home / Self Care | Payer: Medicare HMO | Source: Ambulatory Visit | Attending: Radiation Oncology | Admitting: Radiation Oncology

## 2020-12-05 DIAGNOSIS — C61 Malignant neoplasm of prostate: Secondary | ICD-10-CM | POA: Diagnosis not present

## 2020-12-11 ENCOUNTER — Encounter: Payer: Self-pay | Admitting: *Deleted

## 2020-12-11 ENCOUNTER — Ambulatory Visit (HOSPITAL_BASED_OUTPATIENT_CLINIC_OR_DEPARTMENT_OTHER): Payer: Medicare HMO | Admitting: *Deleted

## 2020-12-11 DIAGNOSIS — C61 Malignant neoplasm of prostate: Secondary | ICD-10-CM

## 2020-12-11 NOTE — Progress Notes (Signed)
2 Identifiers used for verification purposes for this appointment. SCP reviewed and completed  SDOH assessed and completed. Recommendations made to increase exercise when intermittent  pain back is under control. Rates back pain 8/10. Pt does yardwork twice a week.This is a chronic problem for him, nothing new.  Pt does have some concerns about erectile dysfunction. He will address this at his urology appt in January 2023. Pt has not had flu vaccine for 2022, but says he is thinking about getting it. I advised him to do so. Pt has had no Covid boosters, did have the J&J initial Covid vaccine. Pt said he still experiences urine frequency and urgency with a weak flow. No issues with bowels . Last colonoscopy in 2020.

## 2020-12-28 ENCOUNTER — Encounter: Payer: Self-pay | Admitting: Radiation Oncology

## 2020-12-28 NOTE — Progress Notes (Signed)
  Radiation Oncology         [phone] ________________________________  Name: WOJCIECH WILLETTS MRN: [id number]  Date: 12/28/2020  DOB: 1950-05-18  3D Planning Note   Prostate Brachytherapy Post-Implant Dosimetry  Diagnosis:  70 y.o. gentleman with Stage T1c adenocarcinoma of the prostate with Gleason score of 3+4, and PSA of 5.3.  Narrative: On a previous date, LANNIE YUSUF returned following prostate seed implantation for post implant planning. He underwent CT scan complex simulation to delineate the three-dimensional structures of the pelvis and demonstrate the radiation distribution.  Since that time, the seed localization, and complex isodose planning with dose volume histograms have now been completed.  Results:   Prostate Coverage - The dose of radiation delivered to the 90% or more of the prostate gland (D90) was 120.36% of the prescription dose. This exceeds our goal of greater than 90%. Rectal Sparing - The volume of rectal tissue receiving the prescription dose or higher was 0.0 cc. This falls under our thresholds tolerance of 1.0 cc.  Impression: The prostate seed implant appears to show adequate target coverage and appropriate rectal sparing.  Plan:  The patient will continue to follow with urology for ongoing PSA determinations. I would anticipate a high likelihood for local tumor control with minimal risk for rectal morbidity.  ________________________________  [name] [name], M.D.

## 2021-01-07 DIAGNOSIS — Z79899 Other long term (current) drug therapy: Secondary | ICD-10-CM | POA: Diagnosis not present

## 2021-01-07 DIAGNOSIS — M353 Polymyalgia rheumatica: Secondary | ICD-10-CM | POA: Diagnosis not present

## 2021-01-16 DIAGNOSIS — G7000 Myasthenia gravis without (acute) exacerbation: Secondary | ICD-10-CM | POA: Diagnosis not present

## 2021-01-16 DIAGNOSIS — A770 Spotted fever due to Rickettsia rickettsii: Secondary | ICD-10-CM | POA: Diagnosis not present

## 2021-01-16 DIAGNOSIS — Z13820 Encounter for screening for osteoporosis: Secondary | ICD-10-CM | POA: Diagnosis not present

## 2021-01-16 DIAGNOSIS — E039 Hypothyroidism, unspecified: Secondary | ICD-10-CM | POA: Diagnosis not present

## 2021-01-16 DIAGNOSIS — E663 Overweight: Secondary | ICD-10-CM | POA: Diagnosis not present

## 2021-01-16 DIAGNOSIS — M353 Polymyalgia rheumatica: Secondary | ICD-10-CM | POA: Diagnosis not present

## 2021-01-16 DIAGNOSIS — E785 Hyperlipidemia, unspecified: Secondary | ICD-10-CM | POA: Diagnosis not present

## 2021-01-16 DIAGNOSIS — C61 Malignant neoplasm of prostate: Secondary | ICD-10-CM | POA: Diagnosis not present

## 2021-01-16 DIAGNOSIS — R5383 Other fatigue: Secondary | ICD-10-CM | POA: Diagnosis not present

## 2021-01-16 DIAGNOSIS — I10 Essential (primary) hypertension: Secondary | ICD-10-CM | POA: Diagnosis not present

## 2021-02-03 ENCOUNTER — Telehealth: Payer: Self-pay | Admitting: *Deleted

## 2021-02-03 NOTE — Telephone Encounter (Signed)
RETURNED PATIENT'S PHONE CALL, LVM FOR A RETURN CALL 

## 2021-02-10 DIAGNOSIS — H6120 Impacted cerumen, unspecified ear: Secondary | ICD-10-CM | POA: Diagnosis not present

## 2021-02-10 DIAGNOSIS — Z299 Encounter for prophylactic measures, unspecified: Secondary | ICD-10-CM | POA: Diagnosis not present

## 2021-02-10 DIAGNOSIS — G7000 Myasthenia gravis without (acute) exacerbation: Secondary | ICD-10-CM | POA: Diagnosis not present

## 2021-02-10 DIAGNOSIS — D692 Other nonthrombocytopenic purpura: Secondary | ICD-10-CM | POA: Diagnosis not present

## 2021-02-10 DIAGNOSIS — Z789 Other specified health status: Secondary | ICD-10-CM | POA: Diagnosis not present

## 2021-02-10 DIAGNOSIS — H6691 Otitis media, unspecified, right ear: Secondary | ICD-10-CM | POA: Diagnosis not present

## 2021-02-11 DIAGNOSIS — N5201 Erectile dysfunction due to arterial insufficiency: Secondary | ICD-10-CM | POA: Diagnosis not present

## 2021-02-11 DIAGNOSIS — C61 Malignant neoplasm of prostate: Secondary | ICD-10-CM | POA: Diagnosis not present

## 2021-02-11 DIAGNOSIS — R350 Frequency of micturition: Secondary | ICD-10-CM | POA: Diagnosis not present

## 2021-02-11 DIAGNOSIS — R3912 Poor urinary stream: Secondary | ICD-10-CM | POA: Diagnosis not present

## 2021-02-17 DIAGNOSIS — I10 Essential (primary) hypertension: Secondary | ICD-10-CM | POA: Diagnosis not present

## 2021-02-17 DIAGNOSIS — Z6828 Body mass index (BMI) 28.0-28.9, adult: Secondary | ICD-10-CM | POA: Diagnosis not present

## 2021-02-17 DIAGNOSIS — H938X3 Other specified disorders of ear, bilateral: Secondary | ICD-10-CM | POA: Diagnosis not present

## 2021-02-17 DIAGNOSIS — Z299 Encounter for prophylactic measures, unspecified: Secondary | ICD-10-CM | POA: Diagnosis not present

## 2021-03-18 DIAGNOSIS — J069 Acute upper respiratory infection, unspecified: Secondary | ICD-10-CM | POA: Diagnosis not present

## 2021-03-18 DIAGNOSIS — F17210 Nicotine dependence, cigarettes, uncomplicated: Secondary | ICD-10-CM | POA: Diagnosis not present

## 2021-03-18 DIAGNOSIS — E039 Hypothyroidism, unspecified: Secondary | ICD-10-CM | POA: Diagnosis not present

## 2021-03-18 DIAGNOSIS — R69 Illness, unspecified: Secondary | ICD-10-CM | POA: Diagnosis not present

## 2021-03-18 DIAGNOSIS — Z299 Encounter for prophylactic measures, unspecified: Secondary | ICD-10-CM | POA: Diagnosis not present

## 2021-03-18 DIAGNOSIS — I10 Essential (primary) hypertension: Secondary | ICD-10-CM | POA: Diagnosis not present

## 2021-03-25 DIAGNOSIS — R351 Nocturia: Secondary | ICD-10-CM | POA: Diagnosis not present

## 2021-03-25 DIAGNOSIS — R3912 Poor urinary stream: Secondary | ICD-10-CM | POA: Diagnosis not present

## 2021-03-25 DIAGNOSIS — C61 Malignant neoplasm of prostate: Secondary | ICD-10-CM | POA: Diagnosis not present

## 2021-03-25 DIAGNOSIS — N5201 Erectile dysfunction due to arterial insufficiency: Secondary | ICD-10-CM | POA: Diagnosis not present

## 2021-03-25 DIAGNOSIS — R350 Frequency of micturition: Secondary | ICD-10-CM | POA: Diagnosis not present

## 2021-04-02 DIAGNOSIS — M353 Polymyalgia rheumatica: Secondary | ICD-10-CM | POA: Diagnosis not present

## 2021-04-02 DIAGNOSIS — E039 Hypothyroidism, unspecified: Secondary | ICD-10-CM | POA: Diagnosis not present

## 2021-04-02 DIAGNOSIS — Z79899 Other long term (current) drug therapy: Secondary | ICD-10-CM | POA: Diagnosis not present

## 2021-04-06 DIAGNOSIS — C61 Malignant neoplasm of prostate: Secondary | ICD-10-CM | POA: Diagnosis not present

## 2021-04-06 DIAGNOSIS — R69 Illness, unspecified: Secondary | ICD-10-CM | POA: Diagnosis not present

## 2021-04-06 DIAGNOSIS — Z789 Other specified health status: Secondary | ICD-10-CM | POA: Diagnosis not present

## 2021-04-06 DIAGNOSIS — Z2821 Immunization not carried out because of patient refusal: Secondary | ICD-10-CM | POA: Diagnosis not present

## 2021-04-06 DIAGNOSIS — I10 Essential (primary) hypertension: Secondary | ICD-10-CM | POA: Diagnosis not present

## 2021-04-06 DIAGNOSIS — E039 Hypothyroidism, unspecified: Secondary | ICD-10-CM | POA: Diagnosis not present

## 2021-04-06 DIAGNOSIS — Z299 Encounter for prophylactic measures, unspecified: Secondary | ICD-10-CM | POA: Diagnosis not present

## 2021-04-10 DIAGNOSIS — R5383 Other fatigue: Secondary | ICD-10-CM | POA: Diagnosis not present

## 2021-04-10 DIAGNOSIS — A770 Spotted fever due to Rickettsia rickettsii: Secondary | ICD-10-CM | POA: Diagnosis not present

## 2021-04-10 DIAGNOSIS — Z13820 Encounter for screening for osteoporosis: Secondary | ICD-10-CM | POA: Diagnosis not present

## 2021-04-10 DIAGNOSIS — G7000 Myasthenia gravis without (acute) exacerbation: Secondary | ICD-10-CM | POA: Diagnosis not present

## 2021-04-10 DIAGNOSIS — C61 Malignant neoplasm of prostate: Secondary | ICD-10-CM | POA: Diagnosis not present

## 2021-04-10 DIAGNOSIS — I10 Essential (primary) hypertension: Secondary | ICD-10-CM | POA: Diagnosis not present

## 2021-04-10 DIAGNOSIS — M353 Polymyalgia rheumatica: Secondary | ICD-10-CM | POA: Diagnosis not present

## 2021-04-10 DIAGNOSIS — E039 Hypothyroidism, unspecified: Secondary | ICD-10-CM | POA: Diagnosis not present

## 2021-04-10 DIAGNOSIS — E785 Hyperlipidemia, unspecified: Secondary | ICD-10-CM | POA: Diagnosis not present

## 2021-05-04 DIAGNOSIS — E039 Hypothyroidism, unspecified: Secondary | ICD-10-CM | POA: Diagnosis not present

## 2021-05-04 DIAGNOSIS — C61 Malignant neoplasm of prostate: Secondary | ICD-10-CM | POA: Diagnosis not present

## 2021-05-06 DIAGNOSIS — Z299 Encounter for prophylactic measures, unspecified: Secondary | ICD-10-CM | POA: Diagnosis not present

## 2021-05-06 DIAGNOSIS — I10 Essential (primary) hypertension: Secondary | ICD-10-CM | POA: Diagnosis not present

## 2021-05-06 DIAGNOSIS — E039 Hypothyroidism, unspecified: Secondary | ICD-10-CM | POA: Diagnosis not present

## 2021-05-06 DIAGNOSIS — D692 Other nonthrombocytopenic purpura: Secondary | ICD-10-CM | POA: Diagnosis not present

## 2021-05-06 DIAGNOSIS — Z789 Other specified health status: Secondary | ICD-10-CM | POA: Diagnosis not present

## 2021-06-01 DIAGNOSIS — E039 Hypothyroidism, unspecified: Secondary | ICD-10-CM | POA: Diagnosis not present

## 2021-06-22 DIAGNOSIS — R5383 Other fatigue: Secondary | ICD-10-CM | POA: Diagnosis not present

## 2021-06-22 DIAGNOSIS — Z79899 Other long term (current) drug therapy: Secondary | ICD-10-CM | POA: Diagnosis not present

## 2021-06-22 DIAGNOSIS — M353 Polymyalgia rheumatica: Secondary | ICD-10-CM | POA: Diagnosis not present

## 2021-07-03 DIAGNOSIS — I10 Essential (primary) hypertension: Secondary | ICD-10-CM | POA: Diagnosis not present

## 2021-07-03 DIAGNOSIS — E663 Overweight: Secondary | ICD-10-CM | POA: Diagnosis not present

## 2021-07-03 DIAGNOSIS — M25511 Pain in right shoulder: Secondary | ICD-10-CM | POA: Diagnosis not present

## 2021-07-03 DIAGNOSIS — E039 Hypothyroidism, unspecified: Secondary | ICD-10-CM | POA: Diagnosis not present

## 2021-07-03 DIAGNOSIS — E785 Hyperlipidemia, unspecified: Secondary | ICD-10-CM | POA: Diagnosis not present

## 2021-07-03 DIAGNOSIS — R5383 Other fatigue: Secondary | ICD-10-CM | POA: Diagnosis not present

## 2021-07-03 DIAGNOSIS — M353 Polymyalgia rheumatica: Secondary | ICD-10-CM | POA: Diagnosis not present

## 2021-07-03 DIAGNOSIS — A770 Spotted fever due to Rickettsia rickettsii: Secondary | ICD-10-CM | POA: Diagnosis not present

## 2021-07-03 DIAGNOSIS — G7000 Myasthenia gravis without (acute) exacerbation: Secondary | ICD-10-CM | POA: Diagnosis not present

## 2021-07-03 DIAGNOSIS — C61 Malignant neoplasm of prostate: Secondary | ICD-10-CM | POA: Diagnosis not present

## 2021-07-03 DIAGNOSIS — Z13820 Encounter for screening for osteoporosis: Secondary | ICD-10-CM | POA: Diagnosis not present

## 2021-07-03 DIAGNOSIS — M25512 Pain in left shoulder: Secondary | ICD-10-CM | POA: Diagnosis not present

## 2021-07-31 DIAGNOSIS — C61 Malignant neoplasm of prostate: Secondary | ICD-10-CM | POA: Diagnosis not present

## 2021-08-07 DIAGNOSIS — N5201 Erectile dysfunction due to arterial insufficiency: Secondary | ICD-10-CM | POA: Diagnosis not present

## 2021-08-07 DIAGNOSIS — C61 Malignant neoplasm of prostate: Secondary | ICD-10-CM | POA: Diagnosis not present

## 2021-08-07 DIAGNOSIS — R351 Nocturia: Secondary | ICD-10-CM | POA: Diagnosis not present

## 2021-08-19 DIAGNOSIS — E039 Hypothyroidism, unspecified: Secondary | ICD-10-CM | POA: Diagnosis not present

## 2021-08-19 DIAGNOSIS — C61 Malignant neoplasm of prostate: Secondary | ICD-10-CM | POA: Diagnosis not present

## 2021-08-19 DIAGNOSIS — Z6828 Body mass index (BMI) 28.0-28.9, adult: Secondary | ICD-10-CM | POA: Diagnosis not present

## 2021-08-19 DIAGNOSIS — Z789 Other specified health status: Secondary | ICD-10-CM | POA: Diagnosis not present

## 2021-08-19 DIAGNOSIS — D72829 Elevated white blood cell count, unspecified: Secondary | ICD-10-CM | POA: Diagnosis not present

## 2021-08-19 DIAGNOSIS — Z299 Encounter for prophylactic measures, unspecified: Secondary | ICD-10-CM | POA: Diagnosis not present

## 2021-08-19 DIAGNOSIS — I10 Essential (primary) hypertension: Secondary | ICD-10-CM | POA: Diagnosis not present

## 2021-08-31 DIAGNOSIS — D72829 Elevated white blood cell count, unspecified: Secondary | ICD-10-CM | POA: Diagnosis not present

## 2021-08-31 DIAGNOSIS — N3000 Acute cystitis without hematuria: Secondary | ICD-10-CM | POA: Diagnosis not present

## 2021-09-17 DIAGNOSIS — Z8546 Personal history of malignant neoplasm of prostate: Secondary | ICD-10-CM | POA: Diagnosis not present

## 2021-09-17 DIAGNOSIS — Z79899 Other long term (current) drug therapy: Secondary | ICD-10-CM | POA: Diagnosis not present

## 2021-09-17 DIAGNOSIS — Z7722 Contact with and (suspected) exposure to environmental tobacco smoke (acute) (chronic): Secondary | ICD-10-CM | POA: Diagnosis not present

## 2021-09-17 DIAGNOSIS — I2510 Atherosclerotic heart disease of native coronary artery without angina pectoris: Secondary | ICD-10-CM | POA: Diagnosis not present

## 2021-09-17 DIAGNOSIS — I10 Essential (primary) hypertension: Secondary | ICD-10-CM | POA: Diagnosis not present

## 2021-09-17 DIAGNOSIS — M353 Polymyalgia rheumatica: Secondary | ICD-10-CM | POA: Diagnosis not present

## 2021-09-17 DIAGNOSIS — Z7689 Persons encountering health services in other specified circumstances: Secondary | ICD-10-CM | POA: Diagnosis not present

## 2021-09-17 DIAGNOSIS — G7000 Myasthenia gravis without (acute) exacerbation: Secondary | ICD-10-CM | POA: Diagnosis not present

## 2021-09-17 DIAGNOSIS — E669 Obesity, unspecified: Secondary | ICD-10-CM | POA: Diagnosis not present

## 2021-09-17 DIAGNOSIS — Z23 Encounter for immunization: Secondary | ICD-10-CM | POA: Diagnosis not present

## 2021-09-17 DIAGNOSIS — E785 Hyperlipidemia, unspecified: Secondary | ICD-10-CM | POA: Diagnosis not present

## 2021-09-17 DIAGNOSIS — E039 Hypothyroidism, unspecified: Secondary | ICD-10-CM | POA: Diagnosis not present

## 2021-09-17 DIAGNOSIS — N529 Male erectile dysfunction, unspecified: Secondary | ICD-10-CM | POA: Diagnosis not present

## 2021-09-30 DIAGNOSIS — M353 Polymyalgia rheumatica: Secondary | ICD-10-CM | POA: Diagnosis not present

## 2021-09-30 DIAGNOSIS — Z79899 Other long term (current) drug therapy: Secondary | ICD-10-CM | POA: Diagnosis not present

## 2021-10-14 DIAGNOSIS — I10 Essential (primary) hypertension: Secondary | ICD-10-CM | POA: Diagnosis not present

## 2021-10-14 DIAGNOSIS — E039 Hypothyroidism, unspecified: Secondary | ICD-10-CM | POA: Diagnosis not present

## 2021-10-14 DIAGNOSIS — A770 Spotted fever due to Rickettsia rickettsii: Secondary | ICD-10-CM | POA: Diagnosis not present

## 2021-10-14 DIAGNOSIS — R5383 Other fatigue: Secondary | ICD-10-CM | POA: Diagnosis not present

## 2021-10-14 DIAGNOSIS — M25511 Pain in right shoulder: Secondary | ICD-10-CM | POA: Diagnosis not present

## 2021-10-14 DIAGNOSIS — C61 Malignant neoplasm of prostate: Secondary | ICD-10-CM | POA: Diagnosis not present

## 2021-10-14 DIAGNOSIS — G7000 Myasthenia gravis without (acute) exacerbation: Secondary | ICD-10-CM | POA: Diagnosis not present

## 2021-10-14 DIAGNOSIS — Z13820 Encounter for screening for osteoporosis: Secondary | ICD-10-CM | POA: Diagnosis not present

## 2021-10-14 DIAGNOSIS — M25512 Pain in left shoulder: Secondary | ICD-10-CM | POA: Diagnosis not present

## 2021-10-14 DIAGNOSIS — E663 Overweight: Secondary | ICD-10-CM | POA: Diagnosis not present

## 2021-10-14 DIAGNOSIS — E785 Hyperlipidemia, unspecified: Secondary | ICD-10-CM | POA: Diagnosis not present

## 2021-10-14 DIAGNOSIS — M353 Polymyalgia rheumatica: Secondary | ICD-10-CM | POA: Diagnosis not present

## 2021-11-09 DIAGNOSIS — Z0100 Encounter for examination of eyes and vision without abnormal findings: Secondary | ICD-10-CM | POA: Diagnosis not present

## 2021-11-09 DIAGNOSIS — H524 Presbyopia: Secondary | ICD-10-CM | POA: Diagnosis not present

## 2021-11-12 DIAGNOSIS — E7800 Pure hypercholesterolemia, unspecified: Secondary | ICD-10-CM | POA: Diagnosis not present

## 2021-11-12 DIAGNOSIS — Z0000 Encounter for general adult medical examination without abnormal findings: Secondary | ICD-10-CM | POA: Diagnosis not present

## 2021-11-12 DIAGNOSIS — E039 Hypothyroidism, unspecified: Secondary | ICD-10-CM | POA: Diagnosis not present

## 2021-11-12 DIAGNOSIS — I10 Essential (primary) hypertension: Secondary | ICD-10-CM | POA: Diagnosis not present

## 2021-11-12 DIAGNOSIS — Z79899 Other long term (current) drug therapy: Secondary | ICD-10-CM | POA: Diagnosis not present

## 2021-11-12 DIAGNOSIS — Z7189 Other specified counseling: Secondary | ICD-10-CM | POA: Diagnosis not present

## 2021-11-12 DIAGNOSIS — Z23 Encounter for immunization: Secondary | ICD-10-CM | POA: Diagnosis not present

## 2021-11-12 DIAGNOSIS — Z1339 Encounter for screening examination for other mental health and behavioral disorders: Secondary | ICD-10-CM | POA: Diagnosis not present

## 2021-11-12 DIAGNOSIS — Z299 Encounter for prophylactic measures, unspecified: Secondary | ICD-10-CM | POA: Diagnosis not present

## 2021-11-12 DIAGNOSIS — Z6827 Body mass index (BMI) 27.0-27.9, adult: Secondary | ICD-10-CM | POA: Diagnosis not present

## 2021-11-12 DIAGNOSIS — C61 Malignant neoplasm of prostate: Secondary | ICD-10-CM | POA: Diagnosis not present

## 2021-11-12 DIAGNOSIS — Z1331 Encounter for screening for depression: Secondary | ICD-10-CM | POA: Diagnosis not present

## 2021-11-19 DIAGNOSIS — R079 Chest pain, unspecified: Secondary | ICD-10-CM | POA: Diagnosis not present

## 2021-11-19 DIAGNOSIS — Z6827 Body mass index (BMI) 27.0-27.9, adult: Secondary | ICD-10-CM | POA: Diagnosis not present

## 2021-11-19 DIAGNOSIS — E039 Hypothyroidism, unspecified: Secondary | ICD-10-CM | POA: Diagnosis not present

## 2021-11-19 DIAGNOSIS — I25119 Atherosclerotic heart disease of native coronary artery with unspecified angina pectoris: Secondary | ICD-10-CM | POA: Diagnosis not present

## 2021-11-19 DIAGNOSIS — I10 Essential (primary) hypertension: Secondary | ICD-10-CM | POA: Diagnosis not present

## 2021-11-19 DIAGNOSIS — Z299 Encounter for prophylactic measures, unspecified: Secondary | ICD-10-CM | POA: Diagnosis not present

## 2021-11-23 DIAGNOSIS — I2510 Atherosclerotic heart disease of native coronary artery without angina pectoris: Secondary | ICD-10-CM | POA: Diagnosis not present

## 2021-12-03 ENCOUNTER — Encounter: Payer: Self-pay | Admitting: *Deleted

## 2021-12-04 ENCOUNTER — Encounter: Payer: Self-pay | Admitting: Internal Medicine

## 2021-12-04 ENCOUNTER — Ambulatory Visit: Payer: Medicare HMO | Attending: Internal Medicine | Admitting: Internal Medicine

## 2021-12-04 ENCOUNTER — Encounter: Payer: Self-pay | Admitting: *Deleted

## 2021-12-04 VITALS — BP 140/62 | HR 65 | Ht 62.0 in | Wt 172.2 lb

## 2021-12-04 DIAGNOSIS — R079 Chest pain, unspecified: Secondary | ICD-10-CM | POA: Insufficient documentation

## 2021-12-04 MED ORDER — CARVEDILOL 3.125 MG PO TABS: 3.1250 mg | ORAL_TABLET | Freq: Two times a day (BID) | ORAL | 6 refills | Status: DC

## 2021-12-04 MED ORDER — ROSUVASTATIN CALCIUM 5 MG PO TABS: 5.0000 mg | ORAL_TABLET | Freq: Every day | ORAL | 6 refills | Status: DC

## 2021-12-04 MED ORDER — AMLODIPINE BESYLATE 10 MG PO TABS: 10.0000 mg | ORAL_TABLET | Freq: Every day | ORAL | 6 refills | Status: DC

## 2021-12-04 MED ORDER — ASPIRIN 81 MG PO TBEC: 81.0000 mg | DELAYED_RELEASE_TABLET | Freq: Every day | ORAL | Status: AC

## 2021-12-04 NOTE — Progress Notes (Signed)
Cardiology Office Note  Date: 12/04/2021   ID: LARS JEZIORSKI, DOB 31-May-1950, MRN [id number]  PCP:  Monico Blitz, MD  Cardiologist:  None Electrophysiologist:  None   Reason for Office Visit: Management of CAD at the request of Dr. Manuella Ghazi   History of Present Illness: Arthur Hart is a 71 y.o. male known to have CAD status post angioplasty of LAD/LM spasm/residual 60% ostial diagonal nonobstructive disease with normal LVEF, HTN, HLD, myasthenia gravis, polyarthritis rheumatica on chronic prednisone was referred to cardiology clinic for management of CAD the request of Dr. Manuella Ghazi.  Patient was lost to follow-up since 2010. He started to develop substernal chest discomfort for the last 2 to 3 months during extreme strenuous activities that was worsened by coughing/deep breath, persistent of low intensity and never resolves. He said he has lingering chest discomfort all day long. Denied any DOE, dizziness, lightness, syncope.  He has LE swelling as he is on his feet all day long. He recently underwent an echocardiogram with his PCP that was normal, per patient. He was also started on metoprolol succinate 12.5 mg once daily by his PCP for blood pressure control. Not on aspirin due to easy bruising.  Not on statin chronically due to myalgias in the past. Patient brought blood pressure log from home, ranging blood pressures are 140-190 SBP.  Past Medical History:  Diagnosis Date   Anemia    CAD (coronary artery disease)    followed by pcp  (last seen by cardiologist, dr degent 2009);  s/p cath 08-03-2004 PCI balloon angioplasty to LAD/ LM spasm/ residual 60% ostial diagonal nonobstructive   Essential hypertension, benign    followed by pcp   GERD (gastroesophageal reflux disease)    Heart murmur    History of COVID-19 01/2019   result in care everywhere;  per pt mild symptoms that resolved   History of kidney stones    History of pericarditis    1980s   History of rheumatic fever as a  child    Hyperlipidemia    Hypothyroidism    followed by pcp   Lower urinary tract symptoms (LUTS)    Myasthenia gravis without exacerbation ([place])    dx as teen  s/p thymectomy 1969 per pt all symptoms resolved with exception bilateral eye drooping (does not see a neurologist)   Polyarthritis rheumatica [facility])    rheumatologist--- dr Scarlette Shorts ([facility]);  pt taking daily prednisone   Prostate cancer [facility])    urologist--- dr pace;  dx 03/ 2022, Gleason 3+4, PSA 5.3   Wears contact lenses     Past Surgical History:  Procedure Laterality Date   CARDIAC CATHETERIZATION  07/30/2004   '@[facility]'$  by dr Lia Foyer;  chest pain and ekg changes;  moderately severe midLAD disease with evidence spasm of unknown etiology, normal LVF   COLONOSCOPY     last one 12/ 2021   CORONARY ANGIOPLASTY  08/03/2004   '@[facility]'$  by dr Lia Foyer;   PCI balloon angioplasty LAD,  LM spasm/  60% ostial diagonal nonobstructive   EXTRACORPOREAL SHOCK WAVE LITHOTRIPSY     1990s   prostate biopsy  04/08/2020   RADIOACTIVE SEED IMPLANT N/A 10/28/2020   Procedure: RADIOACTIVE SEED IMPLANT/BRACHYTHERAPY IMPLANT;  Surgeon: Robley Fries, MD;  Location: [facility];  Service: Urology;  Laterality: N/A;   SPACE OAR INSTILLATION N/A 10/28/2020   Procedure: SPACE OAR INSTILLATION;  Surgeon: Robley Fries, MD;  Location: [facility];  Service: Urology;  Laterality: N/A;   TONSILLECTOMY     child   TOTAL THYMECTOMY  1969    Current Outpatient Medications  Medication Sig Dispense Refill   ALPRAZolam (XANAX) 0.5 MG tablet Take 0.5 mg by mouth every other day.     amLODipine (NORVASC) 5 MG tablet Take 5 mg by mouth at bedtime.     Ascorbic Acid (VITAMIN C PO) Take by mouth daily.     Eszopiclone 3 MG TABS Take 3 mg by mouth every other day.     levothyroxine (SYNTHROID) 200 MCG tablet Take 200 mcg by mouth daily.     lisinopril-hydrochlorothiazide (ZESTORETIC) 20-12.5 MG tablet Take 1 tablet by mouth  daily.     metoprolol succinate (TOPROL-XL) 25 MG 24 hr tablet Take 12.5 mg by mouth daily.     Multiple Vitamins-Minerals (CENTRUM SILVER 50+MEN PO) Take 1 capsule by mouth daily.     predniSONE (DELTASONE) 5 MG tablet Take 5 mg by mouth every morning.     sildenafil (REVATIO) 20 MG tablet Take 20 mg by mouth as directed.     calcium carbonate (TUMS - DOSED IN MG ELEMENTAL CALCIUM) 500 MG chewable tablet Chew 1 tablet by mouth as needed for indigestion or heartburn.     tamsulosin (FLOMAX) 0.4 MG CAPS capsule Take 0.4 mg by mouth. (Patient not taking: Reported on 12/11/2020)     temazepam (RESTORIL) 30 MG capsule Take 30 mg by mouth at bedtime as needed for sleep.     No current facility-administered medications for this visit.   Allergies:  Beta adrenergic blockers   Social History: The patient  reports that he has never smoked. He quit smokeless tobacco use about 11 years ago.  His smokeless tobacco use included chew and snuff. He reports current alcohol use. He reports that he does not use drugs.   Family History: The patient's family history includes Leukemia in his maternal aunt; Lung cancer in his maternal uncle; Prostate cancer in his father.   ROS:  Please see the history of present illness. Otherwise, complete review of systems is positive for none.  All other systems are reviewed and negative.   Physical Exam: VS:  Ht '5\' 2"'$  (1.575 m)   Wt 172 lb 3.2 oz (78.1 kg)   BMI 31.50 kg/m , BMI Body mass index is 31.5 kg/m.  Wt Readings from Last 3 Encounters:  12/04/21 172 lb 3.2 oz (78.1 kg)  11/12/20 168 lb 12.8 oz (76.6 kg)  10/28/20 170 lb 9.6 oz (77.4 kg)    General: Patient appears comfortable at rest. HEENT: Conjunctiva and lids normal, oropharynx clear with moist mucosa. Neck: Supple, no elevated JVP or carotid bruits, no thyromegaly. Lungs: Clear to auscultation, nonlabored breathing at rest. Cardiac: Regular rate and rhythm, no S3 or significant systolic murmur, no  pericardial rub. Abdomen: Soft, nontender, no hepatomegaly, bowel sounds present, no guarding or rebound. Extremities: No pitting edema, distal pulses 2+. Skin: Warm and dry. Musculoskeletal: No kyphosis. Neuropsychiatric: Alert and oriented x3, affect grossly appropriate.  ECG:  An ECG dated 12/04/2021 was personally reviewed today and demonstrated:  Normal sinus rhythm  Recent Labwork: No results found for requested labs within last 365 days.  No results found for: "CHOL", "TRIG", "HDL", "CHOLHDL", "VLDL", "LDLCALC", "LDLDIRECT"  Other Studies Reviewed Today: None  Assessment and Plan: Patient is a 71 year old M known to have CAD status post angioplasty of LAD/LM spasm/residual 60% ostial diagonal nonobstructive disease with normal LVEF, HTN, HLD, myasthenia gravis, polyarthritis rheumatica on  chronic prednisone was referred to cardiology clinic for management of CAD the request of Dr. Manuella Ghazi.  #Mixed chest pain (cardiac and noncardiac) Plan -Chronically elevated blood pressures might be contributing to his chest discomfort. We will manage his blood pressure and reassess him in 1 month to determine if he needs additional cardiac testing. Instructed patient not to restrict his strenuous activities. -Instructed patient to let EMS/ER know if he took Viagra while he was having active chest pain (if he has ER visit). Patient voiced understanding.  #HTN, poorly controlled Plan -Increase amlodipine from 5 mg to 10 mg once daily, switch metoprolol to carvedilol 3.125 mg twice daily, lisinopril-HCTZ 20-12.5 mg once daily.  If patient develops any muscle aches/myasthenia gravis flares, will revert back to his original medications.  #CAD status post angioplasty of LAD/LM spasm/residual 60% ostial diagonal nonobstructive disease with normal LVEF, CCS 1 Plan -Start aspirin 81 mg once daily. Educated patient that bruising is not a contraindication to not to be on aspirin. -Start rosuvastatin 5 mg  nightly. Instructed patient to stop taking rosuvastatin if he develops any muscle aches or myasthenia gravis flares. -Unable to prescribe vessel NTG 0.5 mg as needed or Imdur due to concurrent use of Viagra  #HLD, not at goal (LDL goal less than 70, LDL value 110s in October 2023) Plan -Start rosuvastatin 5 mg nightly. Instructed patient to stop taking rosuvastatin if he develops any muscle aches or myasthenia gravis flares.  Patient voiced understanding.  I have spent a total of 45 minutes with patient reviewing chart, EKGs, labs and examining patient as well as establishing an assessment and plan that was discussed with the patient.  > 50% of time was spent in direct patient care.      Medication Adjustments/Labs and Tests Ordered: Current medicines are reviewed at length with the patient today.  Concerns regarding medicines are outlined above.   Tests Ordered: No orders of the defined types were placed in this encounter.   Medication Changes: No orders of the defined types were placed in this encounter.   Disposition:  Follow up  3 months  Signed Nikie Cid Fidel Levy, MD, 12/04/2021 9:24 AM    [facility] [place], [place], [place] [address]

## 2021-12-04 NOTE — Patient Instructions (Addendum)
Medication Instructions:  Begin Aspirin '81mg'$  daily Increase Amlodipine to '10mg'$  daily  Begin Crestor '5mg'$  at bedtime Begin Coreg 3.'125mg'$  twice a day   Stop Metoprolol (Toprol XL) Continue all other medications.     Labwork: none  Testing/Procedures: none  Follow-Up: 1 month   Any Other Special Instructions Will Be Listed Below (If Applicable).   If you need a refill on your cardiac medications before your next appointment, please call your pharmacy.

## 2022-01-07 ENCOUNTER — Encounter: Payer: Self-pay | Admitting: Internal Medicine

## 2022-01-07 ENCOUNTER — Ambulatory Visit: Payer: Medicare HMO | Attending: Internal Medicine | Admitting: Internal Medicine

## 2022-01-07 ENCOUNTER — Encounter: Payer: Self-pay | Admitting: *Deleted

## 2022-01-07 VITALS — BP 110/60 | HR 62 | Ht 62.0 in | Wt 169.2 lb

## 2022-01-07 DIAGNOSIS — M79606 Pain in leg, unspecified: Secondary | ICD-10-CM | POA: Insufficient documentation

## 2022-01-07 DIAGNOSIS — M353 Polymyalgia rheumatica: Secondary | ICD-10-CM | POA: Diagnosis not present

## 2022-01-07 DIAGNOSIS — Z299 Encounter for prophylactic measures, unspecified: Secondary | ICD-10-CM | POA: Diagnosis not present

## 2022-01-07 DIAGNOSIS — Z6827 Body mass index (BMI) 27.0-27.9, adult: Secondary | ICD-10-CM | POA: Diagnosis not present

## 2022-01-07 DIAGNOSIS — M79604 Pain in right leg: Secondary | ICD-10-CM | POA: Diagnosis not present

## 2022-01-07 DIAGNOSIS — C61 Malignant neoplasm of prostate: Secondary | ICD-10-CM | POA: Diagnosis not present

## 2022-01-07 DIAGNOSIS — I10 Essential (primary) hypertension: Secondary | ICD-10-CM | POA: Diagnosis not present

## 2022-01-07 DIAGNOSIS — Z0000 Encounter for general adult medical examination without abnormal findings: Secondary | ICD-10-CM | POA: Diagnosis not present

## 2022-01-07 DIAGNOSIS — R079 Chest pain, unspecified: Secondary | ICD-10-CM

## 2022-01-07 DIAGNOSIS — R011 Cardiac murmur, unspecified: Secondary | ICD-10-CM | POA: Diagnosis not present

## 2022-01-07 DIAGNOSIS — E782 Mixed hyperlipidemia: Secondary | ICD-10-CM

## 2022-01-07 DIAGNOSIS — M79605 Pain in left leg: Secondary | ICD-10-CM | POA: Diagnosis not present

## 2022-01-07 DIAGNOSIS — I739 Peripheral vascular disease, unspecified: Secondary | ICD-10-CM | POA: Diagnosis not present

## 2022-01-07 MED ORDER — HYDRALAZINE HCL 25 MG PO TABS: 25.0000 mg | ORAL_TABLET | Freq: Two times a day (BID) | ORAL | 1 refills | Status: DC

## 2022-01-07 NOTE — Patient Instructions (Addendum)
Medication Instructions:  Your physician has recommended you make the following change in your medication:  Stay off carvedilol  Stay off rosuvastatin Start hydralazine 25 mg twice daily (hold if top number is less than 100) Continue other medications the same  Labwork: none  Testing/Procedures: Your physician has requested that you have an echocardiogram. Echocardiography is a painless test that uses sound waves to create images of your heart. It provides your doctor with information about the size and shape of your heart and how well your heart's chambers and valves are working. This procedure takes approximately one hour. There are no restrictions for this procedure. Please do NOT wear cologne, perfume, aftershave, or lotions (deodorant is allowed). Please arrive 15 minutes prior to your appointment time. Your physician has requested that you have a lexiscan myoview. For further information please visit [email or web address]. Please follow instruction sheet, as given. Your physician has requested that you have an ankle brachial index (ABI). During this test an ultrasound and blood pressure cuff are used to evaluate the arteries that supply the arms and legs with blood. Allow thirty minutes for this exam. There are no restrictions or special instructions.  Follow-Up: Your physician recommends that you schedule a follow-up appointment in: 6 months  Any Other Special Instructions Will Be Listed Below (If Applicable). You have been referred to PharmD  If you need a refill on your cardiac medications before your next appointment, please call your pharmacy.

## 2022-01-07 NOTE — Progress Notes (Signed)
Cardiology Office Note  Date: 01/07/2022   ID: Romin, Divita March 04, 1950, MRN [id number]  PCP:  Monico Blitz, MD  Cardiologist:  Chalmers Guest, MD Electrophysiologist:  None   Reason for Office Visit: Management of CAD at the request of Dr. Manuella Ghazi   History of Present Illness: FREDDRICK GLADSON is a 71 y.o. male known to have CAD status post angioplasty of LAD/LM spasm/residual 60% ostial diagonal nonobstructive disease with normal LVEF, HTN, HLD, myasthenia gravis, polyarthritis rheumatica on chronic prednisone presented to cardiology clinic for follow-up visit.  Patient was lost to follow-up since 2010. He started to develop substernal chest discomfort, constant present throughout the day, worsened with deep breath/cough and never resolves. Denied any DOE, dizziness, lightness, syncope.  He reported having bilateral leg pain more in the right for which he is getting ultrasound venous Doppler next week to rule out DVT.  He was started on metoprolol by his PCP for blood sugar control which was switched to carvedilol in the last clinic visit.  He stopped taking carvedilol as it was making him feel sick. Not on statin chronically due to myalgias in the past and did not tolerate rosuvastatin that was started in the last clinic visit.  Past Medical History:  Diagnosis Date   Anemia    CAD (coronary artery disease)    followed by pcp  (last seen by cardiologist, dr degent 2009);  s/p cath 08-03-2004 PCI balloon angioplasty to LAD/ LM spasm/ residual 60% ostial diagonal nonobstructive   Essential hypertension, benign    followed by pcp   GERD (gastroesophageal reflux disease)    Heart murmur    History of COVID-19 01/2019   result in care everywhere;  per pt mild symptoms that resolved   History of kidney stones    History of pericarditis    1980s   History of rheumatic fever as a child    Hyperlipidemia    Hypothyroidism    followed by pcp   Lower urinary tract symptoms (LUTS)     Myasthenia gravis without exacerbation ([place])    dx as teen  s/p thymectomy 1969 per pt all symptoms resolved with exception bilateral eye drooping (does not see a neurologist)   Polyarthritis rheumatica [facility])    rheumatologist--- dr Scarlette Shorts ([facility]);  pt taking daily prednisone   Prostate cancer [facility])    urologist--- dr pace;  dx 03/ 2022, Gleason 3+4, PSA 5.3   Wears contact lenses     Past Surgical History:  Procedure Laterality Date   CARDIAC CATHETERIZATION  07/30/2004   '@[facility]'$  by dr Lia Foyer;  chest pain and ekg changes;  moderately severe midLAD disease with evidence spasm of unknown etiology, normal LVF   COLONOSCOPY     last one 12/ 2021   CORONARY ANGIOPLASTY  08/03/2004   '@[facility]'$  by dr Lia Foyer;   PCI balloon angioplasty LAD,  LM spasm/  60% ostial diagonal nonobstructive   EXTRACORPOREAL SHOCK WAVE LITHOTRIPSY     1990s   prostate biopsy  04/08/2020   RADIOACTIVE SEED IMPLANT N/A 10/28/2020   Procedure: RADIOACTIVE SEED IMPLANT/BRACHYTHERAPY IMPLANT;  Surgeon: Robley Fries, MD;  Location: [facility];  Service: Urology;  Laterality: N/A;   SPACE OAR INSTILLATION N/A 10/28/2020   Procedure: SPACE OAR INSTILLATION;  Surgeon: Robley Fries, MD;  Location: [facility];  Service: Urology;  Laterality: N/A;   TONSILLECTOMY     child   [place]  Current Outpatient Medications  Medication Sig Dispense Refill   ALPRAZolam (XANAX) 0.5 MG tablet Take 0.5 mg by mouth every other day.     amLODipine (NORVASC) 2.5 MG tablet Take 5 mg by mouth daily.     Ascorbic Acid (VITAMIN C PO) Take by mouth daily.     aspirin EC 81 MG tablet Take 1 tablet (81 mg total) by mouth daily. Swallow whole.     calcium carbonate (TUMS - DOSED IN MG ELEMENTAL CALCIUM) 500 MG chewable tablet Chew 1 tablet by mouth as needed for indigestion or heartburn.     Eszopiclone 3 MG TABS Take 3 mg by mouth every other day.     levothyroxine (SYNTHROID) 200 MCG  tablet Take 200 mcg by mouth daily.     lisinopril-hydrochlorothiazide (ZESTORETIC) 20-12.5 MG tablet Take 1 tablet by mouth daily.     Multiple Vitamins-Minerals (CENTRUM SILVER 50+MEN PO) Take 1 capsule by mouth daily.     predniSONE (DELTASONE) 5 MG tablet Take 5 mg by mouth every morning.     sildenafil (REVATIO) 20 MG tablet Take 20 mg by mouth as directed.     No current facility-administered medications for this visit.   Allergies:  Beta adrenergic blockers, Carvedilol, and Crestor [rosuvastatin]   Social History: The patient  reports that he has never smoked. He quit smokeless tobacco use about 11 years ago.  His smokeless tobacco use included chew and snuff. He reports current alcohol use. He reports that he does not use drugs.   Family History: The patient's family history includes Leukemia in his maternal aunt; Lung cancer in his maternal uncle; Prostate cancer in his father.   ROS:  Please see the history of present illness. Otherwise, complete review of systems is positive for none.  All other systems are reviewed and negative.   Physical Exam: VS:  BP 110/60   Pulse 62   Ht '5\' 2"'$  (1.575 m)   Wt 169 lb 3.2 oz (76.7 kg)   SpO2 97%   BMI 30.95 kg/m , BMI Body mass index is 30.95 kg/m.  Wt Readings from Last 3 Encounters:  01/07/22 169 lb 3.2 oz (76.7 kg)  12/04/21 172 lb 3.2 oz (78.1 kg)  11/12/20 168 lb 12.8 oz (76.6 kg)    General: Patient appears comfortable at rest. HEENT: Conjunctiva and lids normal, oropharynx clear with moist mucosa. Neck: Supple, no elevated JVP or carotid bruits, no thyromegaly. Lungs: Clear to auscultation, nonlabored breathing at rest. Cardiac: Regular rate and rhythm, no S3 .  Systolic murmur present. Abdomen: Soft, nontender, no hepatomegaly, bowel sounds present, no guarding or rebound. Extremities: No pitting edema Skin: Warm and dry. Musculoskeletal: No kyphosis. Neuropsychiatric: Alert and oriented x3, affect grossly  appropriate.  ECG:  An ECG dated 12/04/2021 was personally reviewed today and demonstrated:  Normal sinus rhythm  Recent Labwork: No results found for requested labs within last 365 days.  No results found for: "CHOL", "TRIG", "HDL", "CHOLHDL", "VLDL", "LDLCALC", "LDLDIRECT"  Other Studies Reviewed Today: None  Assessment and Plan: Patient is a 71 year old M known to have CAD status post angioplasty of LAD/LM spasm/residual 60% ostial diagonal nonobstructive disease with normal LVEF, HTN, HLD, myasthenia gravis, polyarthritis rheumatica on chronic prednisone was referred to cardiology clinic for management of CAD the request of Dr. Manuella Ghazi.  # Atypical chest pain likely pleuritic in nature Plan -Patient has chest pain persistent throughout the day and worsened by coughing/deep breath. Obtain Lexiscan. -ER precautions for chest pain provided  #  HTN, poorly controlled Plan -Continue amlodipine 5 mg once daily, continue lisinopril-HCTZ 20-12.5 mg once daily and start hydralazine 25 mg twice daily.  #CAD status post angioplasty of LAD/LM spasm/residual 60% ostial diagonal nonobstructive disease with normal LVEF, CCS 1 Plan -Continue aspirin 81 mg once daily -Patient intolerant to statins. Cannot start bempedoic acid due to insurance.  I spoke with the [place].D. who stated there were no contraindications to use PCSK9 inhibitors in myasthenia gravis. Pharm.D. referral for PCSK9 inhibitors initiation. -Avoid beta-blockers and L-type calcium channel blockers (verapamil, diltiazem, nifedipine) in myasthenia gravis. -Avoid nitrates due to concurrent use of Viagra -ER precautions for chest pain provided -Obtain 2D echocardiogram  #HLD, not at goal (LDL goal less than 70, LDL value 110s in October 2023) Plan -Patient intolerant to statins. Cannot start bempedoic acid due to insurance.  I spoke with the [place].D. who stated there were no contraindications to use PCSK9 inhibitors in  myasthenia gravis. Pharm.D. referral for PCSK9 inhibitors initiation.  # Bilateral leg pain -ABI lower extremities with ultrasound arterial Doppler  I have spent a total of 33 minutes with patient reviewing chart, EKGs, labs and examining patient as well as establishing an assessment and plan that was discussed with the patient.  > 50% of time was spent in direct patient care.      Medication Adjustments/Labs and Tests Ordered: Current medicines are reviewed at length with the patient today.  Concerns regarding medicines are outlined above.   Tests Ordered: No orders of the defined types were placed in this encounter.   Medication Changes: No orders of the defined types were placed in this encounter.   Disposition:  Follow up  6 months  Signed Jorgen Wolfinger Fidel Levy, MD, 01/07/2022 3:38 PM    [facility] [place], [place], [place] [address]

## 2022-01-08 ENCOUNTER — Ambulatory Visit: Payer: Medicare HMO

## 2022-01-08 ENCOUNTER — Telehealth: Payer: Self-pay | Admitting: Internal Medicine

## 2022-01-08 NOTE — Telephone Encounter (Signed)
Checking percert on the following patient for testing scheduled at [facility].   LEXISCAN   01-15-2022

## 2022-01-11 ENCOUNTER — Other Ambulatory Visit: Payer: Self-pay | Admitting: Internal Medicine

## 2022-01-11 ENCOUNTER — Telehealth: Payer: Self-pay | Admitting: Internal Medicine

## 2022-01-11 DIAGNOSIS — I739 Peripheral vascular disease, unspecified: Secondary | ICD-10-CM

## 2022-01-11 DIAGNOSIS — R079 Chest pain, unspecified: Secondary | ICD-10-CM

## 2022-01-11 DIAGNOSIS — M79605 Pain in left leg: Secondary | ICD-10-CM

## 2022-01-11 DIAGNOSIS — I808 Phlebitis and thrombophlebitis of other sites: Secondary | ICD-10-CM | POA: Diagnosis not present

## 2022-01-11 DIAGNOSIS — R011 Cardiac murmur, unspecified: Secondary | ICD-10-CM

## 2022-01-11 DIAGNOSIS — M79604 Pain in right leg: Secondary | ICD-10-CM

## 2022-01-11 DIAGNOSIS — E782 Mixed hyperlipidemia: Secondary | ICD-10-CM

## 2022-01-11 NOTE — Telephone Encounter (Signed)
Patient is calling about his appt tomorrow with Pharm D.  He is wondering if he still needs to go to it.  He doesn't want to be charge it he doesn't come to it.

## 2022-01-11 NOTE — Telephone Encounter (Signed)
Pt came into the office wanting to know if there are any other options for him medication wise for his Cholesterol, pt does not want to go to [facility] tomorrow.  Please give pt a call at [phone]

## 2022-01-11 NOTE — Telephone Encounter (Signed)
Per pharmacist Tommy Medal, virtual visits are not an option at this time.

## 2022-01-11 NOTE — Telephone Encounter (Signed)
Advised that no virtual visit is available at this time. Agree to keep visit for tomorrow with PharmD.

## 2022-01-12 ENCOUNTER — Telehealth: Payer: Self-pay | Admitting: Pharmacist

## 2022-01-12 ENCOUNTER — Ambulatory Visit: Payer: Medicare HMO | Attending: Internal Medicine | Admitting: Student

## 2022-01-12 DIAGNOSIS — E782 Mixed hyperlipidemia: Secondary | ICD-10-CM

## 2022-01-12 NOTE — Patient Instructions (Signed)
Your Results:             Your most recent labs Goal  Total Cholesterol 185 < 200  Triglycerides 79 < 150  HDL (happy/good cholesterol) 57 > 40  LDL (lousy/bad cholesterol 113 < 70   Medication changes: We will start the process to get PCSK9i (Repatha or Praluent) covered by your insurance.  Once the prior authorization is complete, we will call you to let you know and confirm pharmacy information.      Praluent is a cholesterol medication that improved your body's ability to get rid of "bad cholesterol" known as LDL. It can lower your LDL up to 60%. It is an injection that is given under the skin every 2 weeks. The most common side effects of Praluent include runny nose, symptoms of the common cold, rarely flu or flu-like symptoms, back/muscle pain in about 3-4% of the patients, and redness, pain, or bruising at the injection site.    Repatha is a cholesterol medication that improved your body's ability to get rid of "bad cholesterol" known as LDL. It can lower your LDL up to 60%! It is an injection that is given under the skin every 2 weeks.  The most common side effects of Repatha include runny nose, symptoms of the common cold, rarely flu or flu-like symptoms, back/muscle pain in about 3-4% of the patients, and redness, pain, or bruising at the injection site.   Lab orders: We want to repeat labs after 2-3 months.  We will send you a lab order to remind you once we get closer to that time.        Copay Assistance:  The [employer] foundation offers assistance to help pay for medication copays.  They will cover copays for all cholesterol lowering meds, including statins, fibrates, omega-3 oils, ezetimibe, Repatha, Praluent, Nexletol, Nexlizet.  The cards are usually good for $2,500 or 12 months, whichever comes first. Go to healthwellfoundation.org Click on "Apply Now" Answer questions as to whom is applying (patient or representative) Your disease fund will be "hypercholesterolemia -  Medicare access" Select the cholesterol medication you need assistance with (Repatha, Praluent, Nexlizet...) They will ask question about qualifying diagnosis - you can mark "yes"; and do you have insurance coverage.   When they ask what type of assistance you are interested in - "copay assistance" When you submit, the approval is usually within minutes.  You will need to print the card information from the site You will need to show this information to your pharmacy, they will bill your Medicare Part D plan first -then bill [employer] --for the copay.   You can also call them at [phone], although the hold times can be quite long.

## 2022-01-12 NOTE — Assessment & Plan Note (Addendum)
Assessment:  LDL goal: < 70 mg/dl last LDLc  131  mg/dl (11/2021) Unable to tolerate moderate intensity statin due to myasthenia gravis  Discussed next options (PCSK-9 inhibitors, bempedoic acid and inclisiran); cost, dosing efficacy, administration techniques and side effects  Patient is in agreement to initiate PCSK9i; however concern about co-pay- enrolled in the grant   Plan: Will apply for PA for PCSK9i; will inform patient upon approval (prefers MyChart message) Lipid lab due in 2-3 months after starting [facility]

## 2022-01-12 NOTE — Telephone Encounter (Signed)
PA approved until 01/31/2022. [id number] Enrolled in [place] for Lipid medications Exp: 12/13/2022 CARD NO. [id number]   CARD STATUS Active   BIN [id number]   PCN [id number]   PC GROUP [id number]   Patient informed via MyChart

## 2022-01-12 NOTE — Progress Notes (Unsigned)
Patient ID: Arthur Hart                 DOB: 14-May-1950                    MRN: [id number]      HPI: Arthur Hart is a 71 y.o. male patient referred to lipid clinic by Dr.Mallipeddi.Marland Kitchen PMH is significant for CAD status post angioplasty of LAD/LM spasm/residual 60% ostial diagonal nonobstructive disease with normal LVEF, HTN, HLD, myasthenia gravis, polyarthritis rheumatica on chronic prednisone.  Today we reviewed options for lowering LDL cholesterol, including ezetimibe, PCSK-9 inhibitors, bempedoic acid and inclisiran.  Discussed mechanisms of action, dosing, side effects and potential decreases in LDL cholesterol.  Also reviewed cost information and potential options for patient assistance. Patient states he could not tolerate statins-gives severe muscle weakness and worsen his myasthenia gravis   Diet: generally eats healthy, low salt low fat food   Exercise: does not have set schedule but very active around the house   Social History:  Smoking: Quit long time back Alcohol: socially Recreational drug use: none   Labs: Lipid Panel   Recent labs Oct 2023 Goal  Total Cholesterol 185 < 200  Triglycerides 79 < 150  HDL  57 > 40  LDL  113 < 70     Past Medical History:  Diagnosis Date   Anemia    CAD (coronary artery disease)    followed by pcp  (last seen by cardiologist, dr degent 2009);  s/p cath 08-03-2004 PCI balloon angioplasty to LAD/ LM spasm/ residual 60% ostial diagonal nonobstructive   Essential hypertension, benign    followed by pcp   GERD (gastroesophageal reflux disease)    Heart murmur    History of COVID-19 01/2019   result in care everywhere;  per pt mild symptoms that resolved   History of kidney stones    History of pericarditis    1980s   History of rheumatic fever as a child    Hyperlipidemia    Hypothyroidism    followed by pcp   Lower urinary tract symptoms (LUTS)    Myasthenia gravis without exacerbation ([place])    dx as teen  s/p thymectomy 1969  per pt all symptoms resolved with exception bilateral eye drooping (does not see a neurologist)   Polyarthritis rheumatica [facility])    rheumatologist--- dr Scarlette Shorts ([facility]);  pt taking daily prednisone   Prostate cancer [facility])    urologist--- dr pace;  dx 03/ 2022, Gleason 3+4, PSA 5.3   Wears contact lenses     Current Outpatient Medications on File Prior to Visit  Medication Sig Dispense Refill   ALPRAZolam (XANAX) 0.5 MG tablet Take 0.5 mg by mouth every other day.     amLODipine (NORVASC) 2.5 MG tablet Take 5 mg by mouth daily.     Ascorbic Acid (VITAMIN C PO) Take by mouth daily.     aspirin EC 81 MG tablet Take 1 tablet (81 mg total) by mouth daily. Swallow whole.     calcium carbonate (TUMS - DOSED IN MG ELEMENTAL CALCIUM) 500 MG chewable tablet Chew 1 tablet by mouth as needed for indigestion or heartburn.     Eszopiclone 3 MG TABS Take 3 mg by mouth every other day.     hydrALAZINE (APRESOLINE) 25 MG tablet Take 1 tablet (25 mg total) by mouth 2 (two) times daily. 60 tablet 1   levothyroxine (SYNTHROID) 200 MCG tablet Take 200 mcg by mouth  daily.     lisinopril-hydrochlorothiazide (ZESTORETIC) 20-12.5 MG tablet Take 1 tablet by mouth daily.     Multiple Vitamins-Minerals (CENTRUM SILVER 50+MEN PO) Take 1 capsule by mouth daily.     predniSONE (DELTASONE) 5 MG tablet Take 5 mg by mouth every morning.     sildenafil (REVATIO) 20 MG tablet Take 20 mg by mouth as directed.     No current facility-administered medications on file prior to visit.    Allergies  Allergen Reactions   Beta Adrenergic Blockers Other (See Comments)    contraindicated in myasthenia gravis   Carvedilol     Myasthenia gravis    Crestor [Rosuvastatin]     Myasthenia gravis     Problem  Hyperlipidemia   Qualifier: Diagnosis of By: Dannielle Burn, MD, Sandrea Matte  Current Medications: none  Intolerances: Rosuvastatin 5 mg atorvastatin 20 mg - severe muscle weakness worsens myasthenia gravis  Risk Factors:  CAD, poorly controlled hypertension LDL goal: <70 mg/dl     Hyperlipidemia Assessment:  LDL goal: < 70 mg/dl last LDLc  131  mg/dl (11/2021) Unable to tolerate moderate intensity statin due to myasthenia gravis  Discussed next options (PCSK-9 inhibitors, bempedoic acid and inclisiran); cost, dosing efficacy, administration techniques and side effects  Patient is in agreement to initiate PCSK9i; however concern about co-pay- enrolled in the grant   Plan: Will apply for PA for PCSK9i; will inform patient upon approval (prefers MyChart message) Lipid lab due in 2-3 months after starting PCSK9i    Thank you,  Cammy Copa, Pharm.D [facility] [address], [place], [place] [address]  Phone: [phone]; Fax: [phone]

## 2022-01-13 ENCOUNTER — Other Ambulatory Visit: Payer: Self-pay | Admitting: Pharmacist

## 2022-01-13 DIAGNOSIS — E782 Mixed hyperlipidemia: Secondary | ICD-10-CM

## 2022-01-13 MED ORDER — PRALUENT 75 MG/ML ~~LOC~~ SOAJ: 75.0000 mg | SUBCUTANEOUS | 3 refills | Status: DC

## 2022-01-15 ENCOUNTER — Encounter (HOSPITAL_COMMUNITY): Payer: Self-pay

## 2022-01-15 ENCOUNTER — Ambulatory Visit (HOSPITAL_COMMUNITY)
Admission: RE | Admit: 2022-01-15 | Discharge: 2022-01-15 | Disposition: A | Discharge status: Home / Self Care | Payer: Medicare HMO | Source: Ambulatory Visit | Attending: Internal Medicine | Admitting: Internal Medicine

## 2022-01-15 ENCOUNTER — Encounter (HOSPITAL_COMMUNITY)
Admission: RE | Admit: 2022-01-15 | Discharge: 2022-01-15 | Disposition: A | Discharge status: Home / Self Care | Payer: Medicare HMO | Source: Ambulatory Visit | Attending: Internal Medicine | Admitting: Internal Medicine

## 2022-01-15 DIAGNOSIS — R079 Chest pain, unspecified: Secondary | ICD-10-CM | POA: Diagnosis not present

## 2022-01-15 LAB — NM MYOCAR MULTI W/SPECT W/WALL MOTION / EF
LV dias vol: 107 mL (ref 62–150)
LV sys vol: 40 mL
Nuc Stress EF: 63 %
Peak HR: 84 {beats}/min
RATE: 0.3
Rest HR: 54 {beats}/min
Rest Nuclear Isotope Dose: 11 mCi
SDS: 0
SRS: 1
SSS: 1
ST Depression (mm): 0 mm
Stress Nuclear Isotope Dose: 33 mCi
TID: 1.25

## 2022-01-15 MED ORDER — REGADENOSON 0.4 MG/5ML IV SOLN
INTRAVENOUS | Status: AC
  Administered 2022-01-15: 0.4 mg
  Filled 2022-01-15: qty 5

## 2022-01-15 MED ORDER — SODIUM CHLORIDE FLUSH 0.9 % IV SOLN
INTRAVENOUS | Status: AC
  Administered 2022-01-15: 10 mL
  Filled 2022-01-15: qty 10

## 2022-01-15 MED ORDER — TECHNETIUM TC 99M TETROFOSMIN IV KIT
10.0000 | PACK | Freq: Once | INTRAVENOUS | Status: AC | PRN
  Administered 2022-01-15: 11 via INTRAVENOUS

## 2022-01-15 MED ORDER — TECHNETIUM TC 99M TETROFOSMIN IV KIT
30.0000 | PACK | Freq: Once | INTRAVENOUS | Status: AC | PRN
  Administered 2022-01-15: 33 via INTRAVENOUS

## 2022-01-21 ENCOUNTER — Ambulatory Visit (INDEPENDENT_AMBULATORY_CARE_PROVIDER_SITE_OTHER): Payer: Medicare HMO

## 2022-01-21 ENCOUNTER — Telehealth: Payer: Self-pay | Admitting: Internal Medicine

## 2022-01-21 ENCOUNTER — Ambulatory Visit: Payer: Medicare HMO | Attending: Internal Medicine

## 2022-01-21 DIAGNOSIS — R011 Cardiac murmur, unspecified: Secondary | ICD-10-CM | POA: Diagnosis not present

## 2022-01-21 DIAGNOSIS — I739 Peripheral vascular disease, unspecified: Secondary | ICD-10-CM | POA: Diagnosis not present

## 2022-01-21 NOTE — Telephone Encounter (Signed)
Patient states that he has not received a telephone call regarding his test that he had done recently.

## 2022-01-22 LAB — ECHOCARDIOGRAM COMPLETE
AR max vel: 2.61 cm2
AV Area VTI: 2.96 cm2
AV Area mean vel: 3.01 cm2
AV Mean grad: 6.5 mmHg
AV Peak grad: 12.6 mmHg
Ao pk vel: 1.78 m/s
Area-P 1/2: 3.19 cm2
Calc EF: 82.6 %
MV M vel: 4.71 m/s
MV Peak grad: 88.7 mmHg
S' Lateral: 2.4 cm
Single Plane A2C EF: 85.4 %
Single Plane A4C EF: 78.2 %

## 2022-01-26 ENCOUNTER — Telehealth: Payer: Self-pay

## 2022-01-26 ENCOUNTER — Telehealth: Payer: Self-pay | Admitting: Internal Medicine

## 2022-01-26 NOTE — Telephone Encounter (Signed)
Patient returned call for test results.  Can be reached on cell phone or land line at [phone].

## 2022-01-26 NOTE — Telephone Encounter (Signed)
Patient notified and verbalized understanding. Patient had no questions or concerns at this time. PCP copied 

## 2022-01-26 NOTE — Telephone Encounter (Signed)
Sent to [place] office

## 2022-01-26 NOTE — Telephone Encounter (Signed)
-----   Message from Chalmers Guest, MD sent at 01/26/2022  9:44 AM EST ----- No evidence of poor circulation in the legs.

## 2022-01-26 NOTE — Telephone Encounter (Signed)
-----   Message from Chalmers Guest, MD sent at 01/26/2022  9:45 AM EST ----- Normal pumping function of the heart and mild mitral regurgitation. Will monitor the echo every 3 years for surveillance of MR. No changes to current plan.

## 2022-01-26 NOTE — Telephone Encounter (Signed)
Completed. Please see previous note.

## 2022-02-02 DIAGNOSIS — Z79899 Other long term (current) drug therapy: Secondary | ICD-10-CM | POA: Diagnosis not present

## 2022-02-02 DIAGNOSIS — M353 Polymyalgia rheumatica: Secondary | ICD-10-CM | POA: Diagnosis not present

## 2022-02-05 ENCOUNTER — Ambulatory Visit: Payer: Medicare HMO | Admitting: Internal Medicine

## 2022-02-05 DIAGNOSIS — M25511 Pain in right shoulder: Secondary | ICD-10-CM | POA: Diagnosis not present

## 2022-02-05 DIAGNOSIS — M25512 Pain in left shoulder: Secondary | ICD-10-CM | POA: Diagnosis not present

## 2022-02-05 DIAGNOSIS — C61 Malignant neoplasm of prostate: Secondary | ICD-10-CM | POA: Diagnosis not present

## 2022-02-05 DIAGNOSIS — M353 Polymyalgia rheumatica: Secondary | ICD-10-CM | POA: Diagnosis not present

## 2022-02-05 DIAGNOSIS — Z13820 Encounter for screening for osteoporosis: Secondary | ICD-10-CM | POA: Diagnosis not present

## 2022-02-05 DIAGNOSIS — M19012 Primary osteoarthritis, left shoulder: Secondary | ICD-10-CM | POA: Diagnosis not present

## 2022-02-05 DIAGNOSIS — E785 Hyperlipidemia, unspecified: Secondary | ICD-10-CM | POA: Diagnosis not present

## 2022-02-05 DIAGNOSIS — E039 Hypothyroidism, unspecified: Secondary | ICD-10-CM | POA: Diagnosis not present

## 2022-02-05 DIAGNOSIS — A770 Spotted fever due to Rickettsia rickettsii: Secondary | ICD-10-CM | POA: Diagnosis not present

## 2022-02-05 DIAGNOSIS — G7000 Myasthenia gravis without (acute) exacerbation: Secondary | ICD-10-CM | POA: Diagnosis not present

## 2022-02-05 DIAGNOSIS — I10 Essential (primary) hypertension: Secondary | ICD-10-CM | POA: Diagnosis not present

## 2022-02-05 DIAGNOSIS — R5383 Other fatigue: Secondary | ICD-10-CM | POA: Diagnosis not present

## 2022-02-12 DIAGNOSIS — N5201 Erectile dysfunction due to arterial insufficiency: Secondary | ICD-10-CM | POA: Diagnosis not present

## 2022-02-12 DIAGNOSIS — C61 Malignant neoplasm of prostate: Secondary | ICD-10-CM | POA: Diagnosis not present

## 2022-02-12 DIAGNOSIS — R351 Nocturia: Secondary | ICD-10-CM | POA: Diagnosis not present

## 2022-02-23 ENCOUNTER — Other Ambulatory Visit (HOSPITAL_COMMUNITY): Payer: Self-pay

## 2022-02-23 ENCOUNTER — Telehealth: Payer: Self-pay

## 2022-02-23 NOTE — Telephone Encounter (Signed)
Update: Prior Authorizaton for Praluent '75MG'$ /ML had now been extended to 02/01/23   (Update for your last chart note)

## 2022-02-25 ENCOUNTER — Ambulatory Visit: Payer: Medicare HMO | Attending: Internal Medicine | Admitting: Internal Medicine

## 2022-02-25 ENCOUNTER — Encounter: Payer: Self-pay | Admitting: Internal Medicine

## 2022-02-25 ENCOUNTER — Telehealth: Payer: Self-pay | Admitting: Internal Medicine

## 2022-02-25 VITALS — BP 134/66 | HR 59 | Ht 63.5 in | Wt 172.4 lb

## 2022-02-25 DIAGNOSIS — R079 Chest pain, unspecified: Secondary | ICD-10-CM | POA: Diagnosis not present

## 2022-02-25 DIAGNOSIS — Z79899 Other long term (current) drug therapy: Secondary | ICD-10-CM | POA: Diagnosis not present

## 2022-02-25 DIAGNOSIS — I340 Nonrheumatic mitral (valve) insufficiency: Secondary | ICD-10-CM

## 2022-02-25 DIAGNOSIS — M353 Polymyalgia rheumatica: Secondary | ICD-10-CM | POA: Diagnosis not present

## 2022-02-25 DIAGNOSIS — R5383 Other fatigue: Secondary | ICD-10-CM | POA: Diagnosis not present

## 2022-02-25 MED ORDER — RANOLAZINE ER 500 MG PO TB12: 500.0000 mg | ORAL_TABLET | Freq: Two times a day (BID) | ORAL | 2 refills | Status: DC

## 2022-02-25 NOTE — Patient Instructions (Addendum)
Medication Instructions:  Your physician has recommended you make the following change in your medication:  Start ranexa 500 mg twice daily Continue other medications the same  Labwork: none  Testing/Procedures: Your physician has requested that you have a cardiac catheterization. Cardiac catheterization is used to diagnose and/or treat various heart conditions. Doctors may recommend this procedure for a number of different reasons. The most common reason is to evaluate chest pain. Chest pain can be a symptom of coronary artery disease (CAD), and cardiac catheterization can show whether plaque is narrowing or blocking your heart's arteries. This procedure is also used to evaluate the valves, as well as measure the blood flow and oxygen levels in different parts of your heart. For further information please visit [email or web address]. Please follow instruction sheet, as given.  Follow-Up: Your physician recommends that you schedule a follow-up appointment in: 1 month  Any Other Special Instructions Will Be Listed Below (If Applicable).  If you need a refill on your cardiac medications before your next appointment, please call your pharmacy.   [facility] [facility] [place] [address] [place] [id number] [place] [address] Dept: [phone] Loc: [phone]  NAYEF COLLEGE  02/25/2022  You are scheduled for a Cardiac Catheterization on Monday, January 29 with Dr. Harrell Gave End.  1. Please arrive at the [facility] (Main Entrance A) at [facility]: [place], [place] [address] at 7:00 AM (This time is two hours before your procedure to ensure your preparation). Free valet parking service is available.   Special note: Every effort is made to have your procedure done on time. Please understand that emergencies sometimes delay scheduled procedures.  2. Diet: Do not eat solid foods after  midnight.  The patient may have clear liquids until 5am upon the day of the procedure.  3. Labs: You will Not need to have blood drawn.  4. Medication instructions in preparation for your procedure: hold lisinopril/hydrochlorothiazide morning of cath   Contrast Allergy: No  On the morning of your procedure, take your Aspirin 81 mg and any morning medicines NOT listed above.  You may use sips of water.  5. Plan for one night stay--bring personal belongings. 6. Bring a current list of your medications and current insurance cards. 7. You MUST have a responsible person to drive you home. 8. Someone MUST be with you the first 24 hours after you arrive home or your discharge will be delayed. 9. Please wear clothes that are easy to get on and off and wear slip-on shoes.  Thank you for allowing [place] to care for you!   --  Invasive Cardiovascular services

## 2022-02-25 NOTE — H&P (View-Only) (Signed)
Cardiology Office Note  Date: 02/25/2022   ID: Arthur Hart, Arthur Hart 11-12-1950, MRN [id number]  PCP:  Monico Blitz, MD  Cardiologist:  Chalmers Guest, MD Electrophysiologist:  None   Reason for Office Visit: Follow-up of abnormal stress test results   History of Present Illness: Arthur Hart is a 72 y.o. male known to have CAD status post angioplasty of LAD/LM spasm/residual 60% ostial diagonal nonobstructive disease in 2006 with normal LVEF, HTN, HLD, myasthenia gravis, polyarthritis rheumatica on chronic prednisone presented to cardiology clinic to discuss abnormal stress test results.  Patient was lost to follow-up since 2010. He started to develop substernal chest discomfort x 3 months, constant present throughout the day, worsened with deep breath/cough and never resolved. He underwent NM stress test on 01/15/2022 which showed small mild intensity basal inferior defect with very mild reversibility and normal nuclear stress EF of 63%. He also has periodic substernal chest pressure radiating to bilateral jaws and lasting for 20 minutes. Denies any DOE, dizziness, lightheadedness, syncope, palpitations and bilateral leg swelling. Due to statin intolerance, he was started on PCSK9 inhibitors.  Past Medical History:  Diagnosis Date   Anemia    CAD (coronary artery disease)    followed by pcp  (last seen by cardiologist, dr degent 2009);  s/p cath 08-03-2004 PCI balloon angioplasty to LAD/ LM spasm/ residual 60% ostial diagonal nonobstructive   Essential hypertension, benign    followed by pcp   GERD (gastroesophageal reflux disease)    Heart murmur    History of COVID-19 01/2019   result in care everywhere;  per pt mild symptoms that resolved   History of kidney stones    History of pericarditis    1980s   History of rheumatic fever as a child    Hyperlipidemia    Hypothyroidism    followed by pcp   Lower urinary tract symptoms (LUTS)    Myasthenia gravis without  exacerbation ([place])    dx as teen  s/p thymectomy 1969 per pt all symptoms resolved with exception bilateral eye drooping (does not see a neurologist)   Polyarthritis rheumatica [facility])    rheumatologist--- dr Scarlette Shorts ([facility]);  pt taking daily prednisone   Prostate cancer [facility])    urologist--- dr pace;  dx 03/ 2022, Gleason 3+4, PSA 5.3   Wears contact lenses     Past Surgical History:  Procedure Laterality Date   CARDIAC CATHETERIZATION  07/30/2004   '@[facility]'$  by dr Lia Foyer;  chest pain and ekg changes;  moderately severe midLAD disease with evidence spasm of unknown etiology, normal LVF   COLONOSCOPY     last one 12/ 2021   CORONARY ANGIOPLASTY  08/03/2004   '@[facility]'$  by dr Lia Foyer;   PCI balloon angioplasty LAD,  LM spasm/  60% ostial diagonal nonobstructive   EXTRACORPOREAL SHOCK WAVE LITHOTRIPSY     1990s   prostate biopsy  04/08/2020   RADIOACTIVE SEED IMPLANT N/A 10/28/2020   Procedure: RADIOACTIVE SEED IMPLANT/BRACHYTHERAPY IMPLANT;  Surgeon: Robley Fries, MD;  Location: [facility];  Service: Urology;  Laterality: N/A;   SPACE OAR INSTILLATION N/A 10/28/2020   Procedure: SPACE OAR INSTILLATION;  Surgeon: Robley Fries, MD;  [facility];  Service: Urology;  Laterality: N/A;   TONSILLECTOMY     child   TOTAL THYMECTOMY  1969    Current Outpatient Medications  Medication Sig Dispense Refill   Alirocumab (PRALUENT) 75 MG/ML SOAJ Inject 75 mg into the skin  every 14 (fourteen) days. 6 mL 3   ALPRAZolam (XANAX) 0.5 MG tablet Take 0.5 mg by mouth every other day.     amLODipine (NORVASC) 2.5 MG tablet Take 5 mg by mouth daily.     Ascorbic Acid (VITAMIN C PO) Take by mouth daily.     aspirin EC 81 MG tablet Take 1 tablet (81 mg total) by mouth daily. Swallow whole.     calcium carbonate (TUMS - DOSED IN MG ELEMENTAL CALCIUM) 500 MG chewable tablet Chew 1 tablet by mouth as needed for indigestion or heartburn.     hydrALAZINE (APRESOLINE) 25  MG tablet Take 1 tablet (25 mg total) by mouth 2 (two) times daily. 60 tablet 1   levothyroxine (SYNTHROID) 200 MCG tablet Take 200 mcg by mouth daily.     lisinopril-hydrochlorothiazide (ZESTORETIC) 20-12.5 MG tablet Take 1 tablet by mouth daily.     Multiple Vitamins-Minerals (CENTRUM SILVER 50+MEN PO) Take 1 capsule by mouth daily.     predniSONE (DELTASONE) 10 MG tablet Take 10 mg by mouth daily with breakfast.     ranolazine (RANEXA) 500 MG 12 hr tablet Take 1 tablet (500 mg total) by mouth 2 (two) times daily. 60 tablet 2   sildenafil (REVATIO) 20 MG tablet Take 20 mg by mouth as directed.     Eszopiclone 3 MG TABS Take 3 mg by mouth every other day. (Patient not taking: Reported on 02/25/2022)     No current facility-administered medications for this visit.   Allergies:  Beta adrenergic blockers, Carvedilol, and Crestor [rosuvastatin]   Social History: The patient  reports that he has never smoked. He quit smokeless tobacco use about 12 years ago.  His smokeless tobacco use included chew and snuff. He reports current alcohol use. He reports that he does not use drugs.   Family History: The patient's family history includes Leukemia in his maternal aunt; Lung cancer in his maternal uncle; Prostate cancer in his father.   ROS:  Please see the history of present illness. Otherwise, complete review of systems is positive for none.  All other systems are reviewed and negative.   Physical Exam: VS:  BP 134/66   Pulse (!) 59   Ht 5' 3.5" (1.613 m)   Wt 172 lb 6.4 oz (78.2 kg)   SpO2 97%   BMI 30.06 kg/m , BMI Body mass index is 30.06 kg/m.  Wt Readings from Last 3 Encounters:  02/25/22 172 lb 6.4 oz (78.2 kg)  01/07/22 169 lb 3.2 oz (76.7 kg)  12/04/21 172 lb 3.2 oz (78.1 kg)    General: Patient appears comfortable at rest. HEENT: Conjunctiva and lids normal, oropharynx clear with moist mucosa. Neck: Supple, no elevated JVP or carotid bruits, no thyromegaly. Lungs: Clear to  auscultation, nonlabored breathing at rest. Cardiac: Regular rate and rhythm, no S3 .  Systolic murmur present. Abdomen: Soft, nontender, no hepatomegaly, bowel sounds present, no guarding or rebound. Extremities: No pitting edema Skin: Warm and dry. Musculoskeletal: No kyphosis. Neuropsychiatric: Alert and oriented x3, affect grossly appropriate.  ECG:  An ECG dated 12/04/2021 was personally reviewed today and demonstrated:  Normal sinus rhythm  Recent Labwork: No results found for requested labs within last 365 days.  No results found for: "CHOL", "TRIG", "HDL", "CHOLHDL", "VLDL", "LDLCALC", "LDLDIRECT"  Other Studies Reviewed Today: NM stress test and echocardiogram  Assessment and Plan: Patient is a 72 year old M known to have CAD status post angioplasty of LAD/LM spasm/residual 60% ostial diagonal nonobstructive disease with  normal LVEF, HTN, HLD, myasthenia gravis, polyarthritis rheumatica on chronic prednisone presented to cardiology clinic for follow-up visit to discuss abnormal stress test.  #CAD status post angioplasty of LAD/LM spasm/residual 60% ostial diagonal nonobstructive disease with normal LVEF, currently has mixed nature of cardiac and noncardiac chest pain at rest Plan -Patient started to develop substernal chest discomfort x 3 months (chest pain-free since last LHC in 2006), constant present throughout the day, worsened with deep breath/cough and never resolved. He also has periodic substernal chest pressure radiating to bilateral jaws, lasting for 20 minutes. Due to mixed nature of chest pain, he underwent NM stress test on 01/15/2022 that showed small mild intensity basal inferior defect with very mild reversibility and normal nuclear stress EF of 63%. Although the nuclear stress test is a low risk study, he continues to have mixed nature of chest pain (cardiac mainly at rest and noncardiac throughout the day) and due to history of angioplasty of proximal LAD/LM disease in  2006, he will benefit from invasive ischemia evaluation with LHC. Risks and benefits of cardiac catheterization have been discussed with the patient. These include bleeding, infection, kidney damage, stroke, heart attack, death.  The patient understands these risks and is willing to proceed. -Continue aspirin 81 mg once daily -Statin intolerance due to myasthenia gravis. Insurance would not approve for bempedoic acid. He was started on PCSK9 inhibitors recently with no side effects. -Avoid beta-blockers and L-type calcium channel blockers (verapamil, diltiazem, nifedipine) in myasthenia gravis. Avoid nitrates due to concurrent use of sildenafil. -Continue amlodipine 5 mg once daily and start ranolazine 500 mg twice daily for antianginal therapy. -ER precautions for chest pain  #HLD, not at goal (LDL goal less than 70, LDL value 110s in October 2023) Plan -Statin intolerance due to myasthenia gravis. Insurance would not approve for bempedoic acid. He was started on PCSK9 inhibitors recently with no side effects.  #HTN, poorly controlled Plan -Continue amlodipine 5 mg once daily, continue lisinopril-HCTZ 20-12.5 mg once daily and hydralazine 25 mg twice daily. -Instructed patient to check blood pressures with arm cuff and not wrist cuff. Will probably need to switch his lisinopril to [facility] in the next clinic visit if his blood pressures continue to be elevated.  # Mild mitral valve regurgitation on 2023 echo -Obtain 2D echocardiogram in 3 years  I have spent a total of 33 minutes with patient reviewing chart, EKGs, labs and examining patient as well as establishing an assessment and plan that was discussed with the patient.  > 50% of time was spent in direct patient care.      Medication Adjustments/Labs and Tests Ordered: Current medicines are reviewed at length with the patient today.  Concerns regarding medicines are outlined above.   Tests Ordered: Orders Placed This Encounter   Procedures   EKG 12-Lead    Medication Changes: Meds ordered this encounter  Medications   ranolazine (RANEXA) 500 MG 12 hr tablet    Sig: Take 1 tablet (500 mg total) by mouth 2 (two) times daily.    Dispense:  60 tablet    Refill:  2    02/25/2022 NEW    Disposition:  Follow up  1 month after [place], MD, 02/25/2022 2:17 PM    [facility] at [place], [place], [place] [address]

## 2022-02-25 NOTE — Progress Notes (Signed)
Cardiology Office Note  Date: 02/25/2022   ID: Khylan, Sawyer [birth year], MRN [id number]  PCP:  Monico Blitz, MD  Cardiologist:  Chalmers Guest, MD Electrophysiologist:  None   Reason for Office Visit: Follow-up of abnormal stress test results   History of Present Illness: Arthur Hart is a 72 y.o. male known to have CAD status post angioplasty of LAD/LM spasm/residual 60% ostial diagonal nonobstructive disease in 2006 with normal LVEF, HTN, HLD, myasthenia gravis, polyarthritis rheumatica on chronic prednisone presented to cardiology clinic to discuss abnormal stress test results.  Patient was lost to follow-up since 2010. He started to develop substernal chest discomfort x 3 months, constant present throughout the day, worsened with deep breath/cough and never resolved. He underwent NM stress test on 01/15/2022 which showed small mild intensity basal inferior defect with very mild reversibility and normal nuclear stress EF of 63%. He also has periodic substernal chest pressure radiating to bilateral jaws and lasting for 20 minutes. Denies any DOE, dizziness, lightheadedness, syncope, palpitations and bilateral leg swelling. Due to statin intolerance, he was started on PCSK9 inhibitors.  Past Medical History:  Diagnosis Date   Anemia    CAD (coronary artery disease)    followed by pcp  (last seen by cardiologist, dr degent 2009);  s/p cath 08-03-2004 PCI balloon angioplasty to LAD/ LM spasm/ residual 60% ostial diagonal nonobstructive   Essential hypertension, benign    followed by pcp   GERD (gastroesophageal reflux disease)    Heart murmur    History of COVID-19 01/2019   result in care everywhere;  per pt mild symptoms that resolved   History of kidney stones    History of pericarditis    1980s   History of rheumatic fever as a child    Hyperlipidemia    Hypothyroidism    followed by pcp   Lower urinary tract symptoms (LUTS)    Myasthenia gravis without  exacerbation ([place])    dx as teen  s/p thymectomy 1969 per pt all symptoms resolved with exception bilateral eye drooping (does not see a neurologist)   Polyarthritis rheumatica [facility])    rheumatologist--- dr Scarlette Shorts ([facility]);  pt taking daily prednisone   Prostate cancer [facility])    urologist--- dr pace;  dx 03/ 2022, Gleason 3+4, PSA 5.3   Wears contact lenses     Past Surgical History:  Procedure Laterality Date   CARDIAC CATHETERIZATION  07/30/2004   '@[facility]'$  by dr Lia Foyer;  chest pain and ekg changes;  moderately severe midLAD disease with evidence spasm of unknown etiology, normal LVF   COLONOSCOPY     last one 12/ 2021   CORONARY ANGIOPLASTY  08/03/2004   '@[facility]  by dr Lia Foyer;   PCI balloon angioplasty LAD,  LM spasm/  60% ostial diagonal nonobstructive   EXTRACORPOREAL SHOCK WAVE LITHOTRIPSY     1990s   prostate biopsy  04/08/2020   RADIOACTIVE SEED IMPLANT N/A 10/28/2020   Procedure: RADIOACTIVE SEED IMPLANT/BRACHYTHERAPY IMPLANT;  Surgeon: Robley Fries, MD;  Location: [place];  Service: Urology;  Laterality: N/A;   SPACE OAR INSTILLATION N/A 10/28/2020   Procedure: SPACE OAR INSTILLATION;  Surgeon: Robley Fries, MD;  Location: [facility];  Service: Urology;  Laterality: N/A;   TONSILLECTOMY     child   TOTAL THYMECTOMY  1969    Current Outpatient Medications  Medication Sig Dispense Refill   Alirocumab (PRALUENT) 75 MG/ML SOAJ Inject 75 mg into the skin  every 14 (fourteen) days. 6 mL 3   ALPRAZolam (XANAX) 0.5 MG tablet Take 0.5 mg by mouth every other day.     amLODipine (NORVASC) 2.5 MG tablet Take 5 mg by mouth daily.     Ascorbic Acid (VITAMIN C PO) Take by mouth daily.     aspirin EC 81 MG tablet Take 1 tablet (81 mg total) by mouth daily. Swallow whole.     calcium carbonate (TUMS - DOSED IN MG ELEMENTAL CALCIUM) 500 MG chewable tablet Chew 1 tablet by mouth as needed for indigestion or heartburn.     hydrALAZINE (APRESOLINE) 25  MG tablet Take 1 tablet (25 mg total) by mouth 2 (two) times daily. 60 tablet 1   levothyroxine (SYNTHROID) 200 MCG tablet Take 200 mcg by mouth daily.     lisinopril-hydrochlorothiazide (ZESTORETIC) 20-12.5 MG tablet Take 1 tablet by mouth daily.     Multiple Vitamins-Minerals (CENTRUM SILVER 50+MEN PO) Take 1 capsule by mouth daily.     predniSONE (DELTASONE) 10 MG tablet Take 10 mg by mouth daily with breakfast.     ranolazine (RANEXA) 500 MG 12 hr tablet Take 1 tablet (500 mg total) by mouth 2 (two) times daily. 60 tablet 2   sildenafil (REVATIO) 20 MG tablet Take 20 mg by mouth as directed.     Eszopiclone 3 MG TABS Take 3 mg by mouth every other day. (Patient not taking: Reported on 02/25/2022)     No current facility-administered medications for this visit.   Allergies:  Beta adrenergic blockers, Carvedilol, and Crestor [rosuvastatin]   Social History: The patient  reports that he has never smoked. He quit smokeless tobacco use about 12 years ago.  His smokeless tobacco use included chew and snuff. He reports current alcohol use. He reports that he does not use drugs.   Family History: The patient's family history includes Leukemia in his maternal aunt; Lung cancer in his maternal uncle; Prostate cancer in his father.   ROS:  Please see the history of present illness. Otherwise, complete review of systems is positive for none.  All other systems are reviewed and negative.   Physical Exam: VS:  BP 134/66   Pulse (!) 59   Ht 5' 3.5" (1.613 m)   Wt 172 lb 6.4 oz (78.2 kg)   SpO2 97%   BMI 30.06 kg/m , BMI Body mass index is 30.06 kg/m.  Wt Readings from Last 3 Encounters:  02/25/22 172 lb 6.4 oz (78.2 kg)  01/07/22 169 lb 3.2 oz (76.7 kg)  12/04/21 172 lb 3.2 oz (78.1 kg)    General: Patient appears comfortable at rest. HEENT: Conjunctiva and lids normal, oropharynx clear with moist mucosa. Neck: Supple, no elevated JVP or carotid bruits, no thyromegaly. Lungs: Clear to  auscultation, nonlabored breathing at rest. Cardiac: Regular rate and rhythm, no S3 .  Systolic murmur present. Abdomen: Soft, nontender, no hepatomegaly, bowel sounds present, no guarding or rebound. Extremities: No pitting edema Skin: Warm and dry. Musculoskeletal: No kyphosis. Neuropsychiatric: Alert and oriented x3, affect grossly appropriate.  ECG:  An ECG dated 12/04/2021 was personally reviewed today and demonstrated:  Normal sinus rhythm  Recent Labwork: No results found for requested labs within last 365 days.  No results found for: "CHOL", "TRIG", "HDL", "CHOLHDL", "VLDL", "LDLCALC", "LDLDIRECT"  Other Studies Reviewed Today: NM stress test and echocardiogram  Assessment and Plan: Patient is a 72 year old M known to have CAD status post angioplasty of LAD/LM spasm/residual 60% ostial diagonal nonobstructive disease with  normal LVEF, HTN, HLD, myasthenia gravis, polyarthritis rheumatica on chronic prednisone presented to cardiology clinic for follow-up visit to discuss abnormal stress test.  #CAD status post angioplasty of LAD/LM spasm/residual 60% ostial diagonal nonobstructive disease with normal LVEF, currently has mixed nature of cardiac and noncardiac chest pain at rest Plan -Patient started to develop substernal chest discomfort x 3 months (chest pain-free since last LHC in 2006), constant present throughout the day, worsened with deep breath/cough and never resolved. He also has periodic substernal chest pressure radiating to bilateral jaws, lasting for 20 minutes. Due to mixed nature of chest pain, he underwent NM stress test on 01/15/2022 that showed small mild intensity basal inferior defect with very mild reversibility and normal nuclear stress EF of 63%. Although the nuclear stress test is a low risk study, he continues to have mixed nature of chest pain (cardiac mainly at rest and noncardiac throughout the day) and due to history of angioplasty of proximal LAD/LM disease in  2006, he will benefit from invasive ischemia evaluation with LHC. Risks and benefits of cardiac catheterization have been discussed with the patient. These include bleeding, infection, kidney damage, stroke, heart attack, death.  The patient understands these risks and is willing to proceed. -Continue aspirin 81 mg once daily -Statin intolerance due to myasthenia gravis. Insurance would not approve for bempedoic acid. He was started on PCSK9 inhibitors recently with no side effects. -Avoid beta-blockers and L-type calcium channel blockers (verapamil, diltiazem, nifedipine) in myasthenia gravis. Avoid nitrates due to concurrent use of sildenafil. -Continue amlodipine 5 mg once daily and start ranolazine 500 mg twice daily for antianginal therapy. -ER precautions for chest pain  #HLD, not at goal (LDL goal less than 70, LDL value 110s in October 2023) Plan -Statin intolerance due to myasthenia gravis. Insurance would not approve for bempedoic acid. He was started on PCSK9 inhibitors recently with no side effects.  #HTN, poorly controlled Plan -Continue amlodipine 5 mg once daily, continue lisinopril-HCTZ 20-12.5 mg once daily and hydralazine 25 mg twice daily. -Instructed patient to check blood pressures with arm cuff and not wrist cuff. Will probably need to switch his lisinopril to [facility] in the next clinic visit if his blood pressures continue to be elevated.  # Mild mitral valve regurgitation on 2023 echo -Obtain 2D echocardiogram in 3 years  I have spent a total of 33 minutes with patient reviewing chart, EKGs, labs and examining patient as well as establishing an assessment and plan that was discussed with the patient.  > 50% of time was spent in direct patient care.      Medication Adjustments/Labs and Tests Ordered: Current medicines are reviewed at length with the patient today.  Concerns regarding medicines are outlined above.   Tests Ordered: Orders Placed This Encounter   Procedures   EKG 12-Lead    Medication Changes: Meds ordered this encounter  Medications   ranolazine (RANEXA) 500 MG 12 hr tablet    Sig: Take 1 tablet (500 mg total) by mouth 2 (two) times daily.    Dispense:  60 tablet    Refill:  2    02/25/2022 NEW    Disposition:  Follow up  1 month after [place], MD, 02/25/2022 2:17 PM    [facility] [place], [place], [place] [address]

## 2022-02-25 NOTE — Telephone Encounter (Signed)
PERCERT:  Left heart cath dx: recurrent chest pain & positive stress test (Monday, 03/01/2022 '@9'$ :00 am with Dr. Saunders Revel

## 2022-02-26 NOTE — Telephone Encounter (Signed)
Patient informed on Jan/26/2024

## 2022-03-01 ENCOUNTER — Encounter (HOSPITAL_COMMUNITY)
Admission: RE | Disposition: A | Discharge status: Home / Self Care | Payer: Self-pay | Source: Ambulatory Visit | Attending: Internal Medicine

## 2022-03-01 ENCOUNTER — Other Ambulatory Visit: Payer: Self-pay

## 2022-03-01 ENCOUNTER — Ambulatory Visit (HOSPITAL_COMMUNITY)
Admission: RE | Admit: 2022-03-01 | Discharge: 2022-03-01 | Disposition: A | Discharge status: Home / Self Care | Payer: Medicare HMO | Source: Ambulatory Visit | Attending: Internal Medicine | Admitting: Internal Medicine

## 2022-03-01 DIAGNOSIS — I340 Nonrheumatic mitral (valve) insufficiency: Secondary | ICD-10-CM | POA: Diagnosis not present

## 2022-03-01 DIAGNOSIS — Z7982 Long term (current) use of aspirin: Secondary | ICD-10-CM | POA: Insufficient documentation

## 2022-03-01 DIAGNOSIS — Z79899 Other long term (current) drug therapy: Secondary | ICD-10-CM | POA: Diagnosis not present

## 2022-03-01 DIAGNOSIS — Z7952 Long term (current) use of systemic steroids: Secondary | ICD-10-CM | POA: Diagnosis not present

## 2022-03-01 DIAGNOSIS — M130 Polyarthritis, unspecified: Secondary | ICD-10-CM | POA: Insufficient documentation

## 2022-03-01 DIAGNOSIS — G7000 Myasthenia gravis without (acute) exacerbation: Secondary | ICD-10-CM | POA: Diagnosis not present

## 2022-03-01 DIAGNOSIS — R9439 Abnormal result of other cardiovascular function study: Secondary | ICD-10-CM

## 2022-03-01 DIAGNOSIS — E785 Hyperlipidemia, unspecified: Secondary | ICD-10-CM | POA: Diagnosis not present

## 2022-03-01 DIAGNOSIS — R079 Chest pain, unspecified: Secondary | ICD-10-CM | POA: Insufficient documentation

## 2022-03-01 DIAGNOSIS — I2510 Atherosclerotic heart disease of native coronary artery without angina pectoris: Secondary | ICD-10-CM | POA: Insufficient documentation

## 2022-03-01 DIAGNOSIS — I10 Essential (primary) hypertension: Secondary | ICD-10-CM | POA: Insufficient documentation

## 2022-03-01 HISTORY — PX: LEFT HEART CATH AND CORONARY ANGIOGRAPHY: CATH118249

## 2022-03-01 SURGERY — LEFT HEART CATH AND CORONARY ANGIOGRAPHY
Anesthesia: LOCAL

## 2022-03-01 MED ORDER — MIDAZOLAM HCL 2 MG/2ML IJ SOLN
INTRAMUSCULAR | Status: AC
  Filled 2022-03-01: qty 2

## 2022-03-01 MED ORDER — HEPARIN (PORCINE) IN NACL 1000-0.9 UT/500ML-% IV SOLN
INTRAVENOUS | Status: DC | PRN
  Administered 2022-03-01 (×2): 500 mL

## 2022-03-01 MED ORDER — LIDOCAINE HCL (PF) 1 % IJ SOLN
INTRAMUSCULAR | Status: AC
  Filled 2022-03-01: qty 30

## 2022-03-01 MED ORDER — FENTANYL CITRATE (PF) 100 MCG/2ML IJ SOLN
INTRAMUSCULAR | Status: AC
  Filled 2022-03-01: qty 2

## 2022-03-01 MED ORDER — MIDAZOLAM HCL 2 MG/2ML IJ SOLN
INTRAMUSCULAR | Status: DC | PRN
  Administered 2022-03-01: 1 mg via INTRAVENOUS

## 2022-03-01 MED ORDER — SODIUM CHLORIDE 0.9% FLUSH: 3.0000 mL | Freq: Two times a day (BID) | INTRAVENOUS | Status: DC

## 2022-03-01 MED ORDER — LIDOCAINE HCL (PF) 1 % IJ SOLN
INTRAMUSCULAR | Status: DC | PRN
  Administered 2022-03-01: 2 mL via INTRADERMAL

## 2022-03-01 MED ORDER — HEPARIN SODIUM (PORCINE) 1000 UNIT/ML IJ SOLN
INTRAMUSCULAR | Status: DC | PRN
  Administered 2022-03-01: 4000 [IU] via INTRAVENOUS

## 2022-03-01 MED ORDER — HEPARIN SODIUM (PORCINE) 1000 UNIT/ML IJ SOLN
INTRAMUSCULAR | Status: AC
  Filled 2022-03-01: qty 10

## 2022-03-01 MED ORDER — SODIUM CHLORIDE 0.9 % IV SOLN: 250.0000 mL | INTRAVENOUS | Status: DC | PRN

## 2022-03-01 MED ORDER — ASPIRIN 81 MG PO CHEW: 81.0000 mg | CHEWABLE_TABLET | ORAL | Status: DC

## 2022-03-01 MED ORDER — FENTANYL CITRATE (PF) 100 MCG/2ML IJ SOLN
INTRAMUSCULAR | Status: DC | PRN
  Administered 2022-03-01: 25 ug via INTRAVENOUS

## 2022-03-01 MED ORDER — HEPARIN (PORCINE) IN NACL 1000-0.9 UT/500ML-% IV SOLN
INTRAVENOUS | Status: AC
  Filled 2022-03-01: qty 1000

## 2022-03-01 MED ORDER — AMLODIPINE BESYLATE 5 MG PO TABS: 7.5000 mg | ORAL_TABLET | Freq: Every day | ORAL | 1 refills | Status: DC

## 2022-03-01 MED ORDER — SODIUM CHLORIDE 0.9 % IV SOLN: INTRAVENOUS | Status: DC

## 2022-03-01 MED ORDER — NITROGLYCERIN 1 MG/10 ML FOR IR/CATH LAB
INTRA_ARTERIAL | Status: AC
  Filled 2022-03-01: qty 10

## 2022-03-01 MED ORDER — HYDRALAZINE HCL 20 MG/ML IJ SOLN: 10.0000 mg | INTRAMUSCULAR | Status: DC | PRN

## 2022-03-01 MED ORDER — IOHEXOL 350 MG/ML SOLN
INTRAVENOUS | Status: DC | PRN
  Administered 2022-03-01: 45 mL

## 2022-03-01 MED ORDER — ACETAMINOPHEN 325 MG PO TABS: 650.0000 mg | ORAL_TABLET | ORAL | Status: DC | PRN

## 2022-03-01 MED ORDER — SODIUM CHLORIDE 0.9 % WEIGHT BASED INFUSION: 1.0000 mL/kg/h | INTRAVENOUS | Status: DC

## 2022-03-01 MED ORDER — SODIUM CHLORIDE 0.9% FLUSH: 3.0000 mL | INTRAVENOUS | Status: DC | PRN

## 2022-03-01 MED ORDER — SODIUM CHLORIDE 0.9 % WEIGHT BASED INFUSION
3.0000 mL/kg/h | INTRAVENOUS | Status: AC
  Administered 2022-03-01: 3 mL/kg/h via INTRAVENOUS

## 2022-03-01 MED ORDER — NITROGLYCERIN 0.4 MG SL SUBL: 0.4000 mg | SUBLINGUAL_TABLET | SUBLINGUAL | 99 refills | Status: DC | PRN

## 2022-03-01 MED ORDER — NITROGLYCERIN 1 MG/10 ML FOR IR/CATH LAB
INTRA_ARTERIAL | Status: DC | PRN
  Administered 2022-03-01 (×2): 200 ug via INTRA_ARTERIAL

## 2022-03-01 SURGICAL SUPPLY — 10 items
CATH 5FR JL3.5 JR4 ANG PIG MP (CATHETERS) IMPLANT
DEVICE RAD COMP TR BAND LRG (VASCULAR PRODUCTS) IMPLANT
ELECT DEFIB PAD ADLT CADENCE (PAD) IMPLANT
GLIDESHEATH SLEND SS 6F .021 (SHEATH) IMPLANT
GUIDEWIRE INQWIRE 1.5J.035X260 (WIRE) IMPLANT
INQWIRE 1.5J .035X260CM (WIRE) ×1
KIT HEART LEFT (KITS) ×1 IMPLANT
PACK CARDIAC CATHETERIZATION (CUSTOM PROCEDURE TRAY) ×1 IMPLANT
TRANSDUCER W/STOPCOCK (MISCELLANEOUS) ×1 IMPLANT
TUBING CIL FLEX 10 FLL-RA (TUBING) ×1 IMPLANT

## 2022-03-01 NOTE — Brief Op Note (Addendum)
BRIEF CARDIAC CATHETERIZATION NOTE  DATE: 03/01/2022  TIME: 9:57 AM  PATIENT:  Arthur Hart  72 y.o. male  PRE-OPERATIVE DIAGNOSIS:  Chest pain and abnormal stress test.  POST-OPERATIVE DIAGNOSIS:  Same  PROCEDURE:  Procedure(s): LEFT HEART CATH AND CORONARY ANGIOGRAPHY (N/A)  SURGEON:  Surgeon(s) and Role:    * Taria Castrillo, MD - Primary  FINDINGS: Moderate to severe single vessel CAD with up to 70% stenoses involving D2 and distal LAD. Mild-moderate non-obstructive LCx and RCA disease. Upper normal LVEDP (15 mmHg).  RECOMMENDATIONS: Escalate medical therapy; increase amlodipine to 7.5 mg daily. Aggressive secondary prevention of coronary artery disease.  Nelva Bush, MD [facility]

## 2022-03-01 NOTE — Interval H&P Note (Signed)
History and Physical Interval Note:  03/01/2022 8:41 AM  Arthur Hart  has presented today for surgery, with the diagnosis of chest pain and positive stress test.  The various methods of treatment have been discussed with the patient and family. After consideration of risks, benefits and other options for treatment, the patient has consented to  Procedure(s): LEFT HEART CATH AND CORONARY ANGIOGRAPHY (N/A) as a surgical intervention.  The patient's history has been reviewed, patient examined, no change in status, stable for surgery.  I have reviewed the patient's chart and labs.  Questions were answered to the patient's satisfaction.    Cath Lab Visit (complete for each Cath Lab visit)  Clinical Evaluation Leading to the Procedure:   ACS: No.  Non-ACS:    Anginal Classification: CCS IV  Anti-ischemic medical therapy: Maximal Therapy (2 or more classes of medications)  Non-Invasive Test Results: Low-risk stress test findings: cardiac mortality <1%/year  Prior CABG: No previous CABG  Noelie Renfrow

## 2022-03-02 ENCOUNTER — Encounter (HOSPITAL_COMMUNITY): Payer: Self-pay | Admitting: Internal Medicine

## 2022-03-04 DIAGNOSIS — Z299 Encounter for prophylactic measures, unspecified: Secondary | ICD-10-CM | POA: Diagnosis not present

## 2022-03-04 DIAGNOSIS — G4700 Insomnia, unspecified: Secondary | ICD-10-CM | POA: Diagnosis not present

## 2022-03-04 DIAGNOSIS — I10 Essential (primary) hypertension: Secondary | ICD-10-CM | POA: Diagnosis not present

## 2022-03-04 DIAGNOSIS — G7000 Myasthenia gravis without (acute) exacerbation: Secondary | ICD-10-CM | POA: Diagnosis not present

## 2022-03-04 DIAGNOSIS — Z6828 Body mass index (BMI) 28.0-28.9, adult: Secondary | ICD-10-CM | POA: Diagnosis not present

## 2022-03-04 DIAGNOSIS — I25119 Atherosclerotic heart disease of native coronary artery with unspecified angina pectoris: Secondary | ICD-10-CM | POA: Diagnosis not present

## 2022-03-05 DIAGNOSIS — I10 Essential (primary) hypertension: Secondary | ICD-10-CM | POA: Diagnosis not present

## 2022-03-05 DIAGNOSIS — Z13820 Encounter for screening for osteoporosis: Secondary | ICD-10-CM | POA: Diagnosis not present

## 2022-03-05 DIAGNOSIS — E039 Hypothyroidism, unspecified: Secondary | ICD-10-CM | POA: Diagnosis not present

## 2022-03-05 DIAGNOSIS — G7000 Myasthenia gravis without (acute) exacerbation: Secondary | ICD-10-CM | POA: Diagnosis not present

## 2022-03-05 DIAGNOSIS — E663 Overweight: Secondary | ICD-10-CM | POA: Diagnosis not present

## 2022-03-05 DIAGNOSIS — A770 Spotted fever due to Rickettsia rickettsii: Secondary | ICD-10-CM | POA: Diagnosis not present

## 2022-03-05 DIAGNOSIS — R5383 Other fatigue: Secondary | ICD-10-CM | POA: Diagnosis not present

## 2022-03-05 DIAGNOSIS — M25512 Pain in left shoulder: Secondary | ICD-10-CM | POA: Diagnosis not present

## 2022-03-05 DIAGNOSIS — M353 Polymyalgia rheumatica: Secondary | ICD-10-CM | POA: Diagnosis not present

## 2022-03-05 DIAGNOSIS — E785 Hyperlipidemia, unspecified: Secondary | ICD-10-CM | POA: Diagnosis not present

## 2022-03-05 DIAGNOSIS — M25511 Pain in right shoulder: Secondary | ICD-10-CM | POA: Diagnosis not present

## 2022-03-05 DIAGNOSIS — C61 Malignant neoplasm of prostate: Secondary | ICD-10-CM | POA: Diagnosis not present

## 2022-03-19 ENCOUNTER — Encounter: Payer: Self-pay | Admitting: Internal Medicine

## 2022-03-19 ENCOUNTER — Ambulatory Visit: Payer: Medicare HMO | Attending: Internal Medicine | Admitting: Internal Medicine

## 2022-03-19 VITALS — BP 144/70 | HR 58 | Ht 63.5 in | Wt 168.8 lb

## 2022-03-19 DIAGNOSIS — I2510 Atherosclerotic heart disease of native coronary artery without angina pectoris: Secondary | ICD-10-CM

## 2022-03-19 MED ORDER — HYDRALAZINE HCL 25 MG PO TABS: 25.0000 mg | ORAL_TABLET | Freq: Two times a day (BID) | ORAL | 2 refills | Status: DC

## 2022-03-19 MED ORDER — AMLODIPINE BESYLATE 5 MG PO TABS: 5.0000 mg | ORAL_TABLET | Freq: Every day | ORAL | 3 refills | Status: AC

## 2022-03-19 MED ORDER — AMLODIPINE BESYLATE 2.5 MG PO TABS: 2.5000 mg | ORAL_TABLET | Freq: Every day | ORAL | 3 refills | Status: DC

## 2022-03-19 MED ORDER — PRALUENT 75 MG/ML ~~LOC~~ SOAJ: 75.0000 mg | SUBCUTANEOUS | 3 refills | Status: DC

## 2022-03-19 NOTE — Progress Notes (Signed)
Cardiology Office Note  Date: 03/19/2022   ID: [name], [name] 08-Feb-1950, MRN [id number]  PCP:  Monico Blitz, MD  Cardiologist:  Chalmers Guest, MD Electrophysiologist:  None   Reason for Office Visit: Follow-up of abnormal stress test results   History of Present Illness: Arthur Hart is a 72 y.o. male known to have CAD status post angioplasty of LAD/LM spasm/residual 60% ostial diagonal nonobstructive disease in 2006, repeat LHC in 02/2022 showing 70% distal LAD/diagonal disease on medical management and normal LVEF, HTN, HLD, myasthenia gravis, polyarthritis rheumatica on chronic prednisone presented to cardiology clinic for follow up visit, post LHC.  Patient reported that he has prednisone dose was increased by 5 mg after which his chest pains completely resolved. LHC from 03/01/2022 showed moderate to severe single-vessel CAD with up to 70% stenosis involving D2 and distal LAD. No intervention was performed as the stress test was deemed to be negative. Mild to moderate nonobstructive LCx and RCA disease was also noted. Overall doing great, denies any symptoms. LHC note recommended to increase amlodipine from 5 mg to 7.5 mg once daily. Patient continues to take 5 mg once daily and not taking Ranexa. Denies any DOE, dizziness, lightheadedness, syncope, palpitations and bilateral leg swelling. Due to statin intolerance, he was started on PCSK9 inhibitors.  Past Medical History:  Diagnosis Date   Anemia    CAD (coronary artery disease)    followed by pcp  (last seen by cardiologist, dr degent 2009);  s/p cath 08-03-2004 PCI balloon angioplasty to LAD/ LM spasm/ residual 60% ostial diagonal nonobstructive   Essential hypertension, benign    followed by pcp   GERD (gastroesophageal reflux disease)    Heart murmur    History of COVID-19 01/2019   result in care everywhere;  per pt mild symptoms that resolved   History of kidney stones    History of pericarditis    1980s    History of rheumatic fever as a child    Hyperlipidemia    Hypothyroidism    followed by pcp   Lower urinary tract symptoms (LUTS)    Myasthenia gravis without exacerbation ([place])    dx as teen  s/p thymectomy 1969 per pt all symptoms resolved with exception bilateral eye drooping (does not see a neurologist)   Polyarthritis rheumatica [facility])    rheumatologist--- dr Scarlette Shorts ([facility]);  pt taking daily prednisone   Prostate cancer [facility])    urologist--- dr pace;  dx 03/ 2022, Gleason 3+4, PSA 5.3   Wears contact lenses     Past Surgical History:  Procedure Laterality Date   CARDIAC CATHETERIZATION  07/30/2004   @[facility]  by dr Lia Foyer;  chest pain and ekg changes;  moderately severe midLAD disease with evidence spasm of unknown etiology, normal LVF   COLONOSCOPY     last one 12/ 2021   CORONARY ANGIOPLASTY  08/03/2004   @[facility]  by dr Lia Foyer;   PCI balloon angioplasty LAD,  LM spasm/  60% ostial diagonal nonobstructive   EXTRACORPOREAL SHOCK WAVE LITHOTRIPSY     1990s   LEFT HEART CATH AND CORONARY ANGIOGRAPHY N/A 03/01/2022   Procedure: LEFT HEART CATH AND CORONARY ANGIOGRAPHY;  Surgeon: Nelva Bush, MD;  Location: [facility];  Service: Cardiovascular;  Laterality: N/A;   prostate biopsy  04/08/2020   RADIOACTIVE SEED IMPLANT N/A 10/28/2020   Procedure: RADIOACTIVE SEED IMPLANT/BRACHYTHERAPY IMPLANT;  Surgeon: Robley Fries, MD;  Location: [place];  Service: Urology;  Laterality: N/A;   SPACE OAR INSTILLATION N/A 10/28/2020   Procedure: SPACE OAR INSTILLATION;  Surgeon: Robley Fries, MD;  Location: [facility];  Service: Urology;  Laterality: N/A;   TONSILLECTOMY     child   TOTAL THYMECTOMY  1969    Current Outpatient Medications  Medication Sig Dispense Refill   ALPRAZolam (XANAX) 0.5 MG tablet Take 0.5 mg by mouth daily as needed for anxiety.     amLODipine (NORVASC) 2.5 MG tablet Take 1 tablet (2.5 mg total) by mouth daily. With 5  mg 90 tablet 3   amLODipine (NORVASC) 5 MG tablet Take 1 tablet (5 mg total) by mouth daily. With 2.5 mg 90 tablet 3   Ascorbic Acid (VITAMIN C) 1000 MG tablet Take 1,000 mg by mouth daily.     aspirin EC 81 MG tablet Take 1 tablet (81 mg total) by mouth daily. Swallow whole.     Eszopiclone 3 MG TABS Take 3 mg by mouth at bedtime as needed.     levothyroxine (SYNTHROID) 200 MCG tablet Take 200 mcg by mouth daily.     lisinopril-hydrochlorothiazide (ZESTORETIC) 20-12.5 MG tablet Take 1 tablet by mouth daily.     Multiple Vitamins-Minerals (CENTRUM SILVER 50+MEN PO) Take 1 capsule by mouth daily.     naproxen sodium (ALEVE) 220 MG tablet Take 220 mg by mouth daily as needed (pain).     neomycin-bacitracin-polymyxin (NEOSPORIN) OINT Apply 1 Application topically as needed for wound care.     nitroGLYCERIN (NITROSTAT) 0.4 MG SL tablet Place 1 tablet (0.4 mg total) under the tongue every 5 (five) minutes as needed for chest pain. Do NOT use if you have taken Viagra (sildenafil) within the last 24 hours. 25 tablet PRN   predniSONE (DELTASONE) 5 MG tablet Take 5 mg by mouth 2 (two) times daily.     sildenafil (REVATIO) 20 MG tablet Take 20 mg by mouth daily as needed (ED).     Alirocumab (PRALUENT) 75 MG/ML SOAJ Inject 75 mg into the skin every 14 (fourteen) days. 6 mL 3   hydrALAZINE (APRESOLINE) 25 MG tablet Take 1 tablet (25 mg total) by mouth 2 (two) times daily. 180 tablet 2   No current facility-administered medications for this visit.   Allergies:  Beta adrenergic blockers, Carvedilol, and Crestor [rosuvastatin]   Social History: The patient  reports that he has never smoked. He quit smokeless tobacco use about 12 years ago.  His smokeless tobacco use included chew and snuff. He reports current alcohol use. He reports that he does not use drugs.   Family History: The patient's family history includes Leukemia in his maternal aunt; Lung cancer in his maternal uncle; Prostate cancer in his  father.   ROS:  Please see the history of present illness. Otherwise, complete review of systems is positive for none.  All other systems are reviewed and negative.   Physical Exam: VS:  BP (!) 144/70   Pulse (!) 58   Ht 5' 3.5" (1.613 m)   Wt 168 lb 12.8 oz (76.6 kg)   SpO2 97%   BMI 29.43 kg/m , BMI Body mass index is 29.43 kg/m.  Wt Readings from Last 3 Encounters:  03/19/22 168 lb 12.8 oz (76.6 kg)  03/01/22 172 lb (78 kg)  02/25/22 172 lb 6.4 oz (78.2 kg)    General: Patient appears comfortable at rest. HEENT: Conjunctiva and lids normal, oropharynx clear with moist mucosa. Neck: Supple, no elevated JVP or carotid bruits, no  thyromegaly. Lungs: Clear to auscultation, nonlabored breathing at rest. Cardiac: Regular rate and rhythm, no S3 .  Systolic murmur present. Abdomen: Soft, nontender, no hepatomegaly, bowel sounds present, no guarding or rebound. Extremities: No pitting edema Skin: Warm and dry. Musculoskeletal: No kyphosis. Neuropsychiatric: Alert and oriented x3, affect grossly appropriate.  ECG:  An ECG dated 12/04/2021 was personally reviewed today and demonstrated:  Normal sinus rhythm  Recent Labwork: No results found for requested labs within last 365 days.  No results found for: "CHOL", "TRIG", "HDL", "CHOLHDL", "VLDL", "LDLCALC", "LDLDIRECT"  Other Studies Reviewed Today: NM stress test and echocardiogram  Assessment and Plan: Patient is a 72 year old M known to have CAD status post angioplasty of LAD/LM spasm/residual 60% ostial diagonal nonobstructive disease, repeat LHC in 02/2022 showing 70% distal LAD/diagonal disease on medical management and normal LVEF  with normal LVEF, HTN, HLD, myasthenia gravis, polyarthritis rheumatica on chronic prednisone presented to cardiology clinic for post [facility] visit.  #CAD status post angioplasty of LAD/LM spasm/residual 60% ostial diagonal nonobstructive disease, repeat LHC in 02/2022 showing 70% distal LAD/diagonal  disease on medical management and normal LVEF, currently angina free Plan -Continue Aspirin 81 mg once daily -Statin intolerance due to myasthenia gravis. Insurance would not approve for bempedoic acid. He was started on PCSK9 inhibitors, Praluent recently with no side effects. -Avoid beta-blockers and L-type calcium channel blockers (verapamil, diltiazem, nifedipine) in myasthenia gravis. Avoid nitrates due to concurrent use of sildenafil. -Continue amlodipine 7.5 mg once daily (increased after LHC, 2.5 mg in the a.m. and 5 mg in the p.m.). Patient not taking Ranexa, DC Ranexa. -ER precautions for chest pain  #HLD, not at goal (LDL goal less than 70, LDL value 110s in October 2023) Plan -Statin intolerance due to myasthenia gravis.  Insurance would not approve for bempedoic acid. He was started on PCSK9 inhibitors, Praluent recently with no side effects.  #HTN, partially controlled Plan -Continue amlodipine 7.5 mg once daily (2.5 mg in the a.m., 5 mg in the p.m.), continue lisinopril-HCTZ 20-12.5 mg once daily and hydralazine 20 mg twice daily.  # Mild mitral valve regurgitation on 2023 echo -Obtain 2D echocardiogram in 3 years  I have spent a total of 33 minutes with patient reviewing chart, EKGs, labs and examining patient as well as establishing an assessment and plan that was discussed with the patient.  > 50% of time was spent in direct patient care.      Medication Adjustments/Labs and Tests Ordered: Current medicines are reviewed at length with the patient today.  Concerns regarding medicines are outlined above.   Tests Ordered: No orders of the defined types were placed in this encounter.   Medication Changes: Meds ordered this encounter  Medications   amLODipine (NORVASC) 5 MG tablet    Sig: Take 1 tablet (5 mg total) by mouth daily. With 2.5 mg    Dispense:  90 tablet    Refill:  3   amLODipine (NORVASC) 2.5 MG tablet    Sig: Take 1 tablet (2.5 mg total) by mouth  daily. With 5 mg    Dispense:  90 tablet    Refill:  3   Alirocumab (PRALUENT) 75 MG/ML SOAJ    Sig: Inject 75 mg into the skin every 14 (fourteen) days.    Dispense:  6 mL    Refill:  3   hydrALAZINE (APRESOLINE) 25 MG tablet    Sig: Take 1 tablet (25 mg total) by mouth 2 (two) times daily.    Dispense:  180 tablet    Refill:  2    01/07/2022 NEW    Disposition:  Follow up  6 months  Signed Azariyah Luhrs Fidel Levy, MD, 03/19/2022 8:57 AM    [facility] [place], [place], [place] [address]

## 2022-03-19 NOTE — Patient Instructions (Addendum)
Medication Instructions:  Your physician has recommended you make the following change in your medication:  Increase amlodipine to 7.5 mg daily (take 5 mg with 2.5 mg daily) Take hydralazine 25 mg twice daily Continue other medications the same  Labwork: none  Testing/Procedures: none  Follow-Up: Your physician recommends that you schedule a follow-up appointment in: 6 months  Any Other Special Instructions Will Be Listed Below (If Applicable).  If you need a refill on your cardiac medications before your next appointment, please call your pharmacy.

## 2022-04-02 DIAGNOSIS — C61 Malignant neoplasm of prostate: Secondary | ICD-10-CM | POA: Diagnosis not present

## 2022-04-02 DIAGNOSIS — Z299 Encounter for prophylactic measures, unspecified: Secondary | ICD-10-CM | POA: Diagnosis not present

## 2022-04-02 DIAGNOSIS — Z6828 Body mass index (BMI) 28.0-28.9, adult: Secondary | ICD-10-CM | POA: Diagnosis not present

## 2022-04-02 DIAGNOSIS — G7000 Myasthenia gravis without (acute) exacerbation: Secondary | ICD-10-CM | POA: Diagnosis not present

## 2022-04-02 DIAGNOSIS — I10 Essential (primary) hypertension: Secondary | ICD-10-CM | POA: Diagnosis not present

## 2022-04-02 DIAGNOSIS — M353 Polymyalgia rheumatica: Secondary | ICD-10-CM | POA: Diagnosis not present

## 2022-04-21 DIAGNOSIS — H16203 Unspecified keratoconjunctivitis, bilateral: Secondary | ICD-10-CM | POA: Diagnosis not present

## 2022-04-21 DIAGNOSIS — E039 Hypothyroidism, unspecified: Secondary | ICD-10-CM | POA: Diagnosis not present

## 2022-04-21 DIAGNOSIS — Z79899 Other long term (current) drug therapy: Secondary | ICD-10-CM | POA: Diagnosis not present

## 2022-04-21 DIAGNOSIS — M353 Polymyalgia rheumatica: Secondary | ICD-10-CM | POA: Diagnosis not present

## 2022-04-30 DIAGNOSIS — M353 Polymyalgia rheumatica: Secondary | ICD-10-CM | POA: Diagnosis not present

## 2022-04-30 DIAGNOSIS — R5383 Other fatigue: Secondary | ICD-10-CM | POA: Diagnosis not present

## 2022-04-30 DIAGNOSIS — E039 Hypothyroidism, unspecified: Secondary | ICD-10-CM | POA: Diagnosis not present

## 2022-04-30 DIAGNOSIS — C61 Malignant neoplasm of prostate: Secondary | ICD-10-CM | POA: Diagnosis not present

## 2022-04-30 DIAGNOSIS — M25511 Pain in right shoulder: Secondary | ICD-10-CM | POA: Diagnosis not present

## 2022-04-30 DIAGNOSIS — D649 Anemia, unspecified: Secondary | ICD-10-CM | POA: Diagnosis not present

## 2022-04-30 DIAGNOSIS — I10 Essential (primary) hypertension: Secondary | ICD-10-CM | POA: Diagnosis not present

## 2022-04-30 DIAGNOSIS — M25512 Pain in left shoulder: Secondary | ICD-10-CM | POA: Diagnosis not present

## 2022-04-30 DIAGNOSIS — A770 Spotted fever due to Rickettsia rickettsii: Secondary | ICD-10-CM | POA: Diagnosis not present

## 2022-04-30 DIAGNOSIS — E785 Hyperlipidemia, unspecified: Secondary | ICD-10-CM | POA: Diagnosis not present

## 2022-04-30 DIAGNOSIS — Z13820 Encounter for screening for osteoporosis: Secondary | ICD-10-CM | POA: Diagnosis not present

## 2022-04-30 DIAGNOSIS — G7000 Myasthenia gravis without (acute) exacerbation: Secondary | ICD-10-CM | POA: Diagnosis not present

## 2022-06-18 DIAGNOSIS — Z7189 Other specified counseling: Secondary | ICD-10-CM | POA: Diagnosis not present

## 2022-06-18 DIAGNOSIS — I739 Peripheral vascular disease, unspecified: Secondary | ICD-10-CM | POA: Diagnosis not present

## 2022-06-18 DIAGNOSIS — I10 Essential (primary) hypertension: Secondary | ICD-10-CM | POA: Diagnosis not present

## 2022-06-18 DIAGNOSIS — D692 Other nonthrombocytopenic purpura: Secondary | ICD-10-CM | POA: Diagnosis not present

## 2022-06-18 DIAGNOSIS — Z0000 Encounter for general adult medical examination without abnormal findings: Secondary | ICD-10-CM | POA: Diagnosis not present

## 2022-06-18 DIAGNOSIS — M353 Polymyalgia rheumatica: Secondary | ICD-10-CM | POA: Diagnosis not present

## 2022-06-18 DIAGNOSIS — Z299 Encounter for prophylactic measures, unspecified: Secondary | ICD-10-CM | POA: Diagnosis not present

## 2022-06-18 DIAGNOSIS — Z1331 Encounter for screening for depression: Secondary | ICD-10-CM | POA: Diagnosis not present

## 2022-06-18 DIAGNOSIS — Z1339 Encounter for screening examination for other mental health and behavioral disorders: Secondary | ICD-10-CM | POA: Diagnosis not present

## 2022-07-02 DIAGNOSIS — I10 Essential (primary) hypertension: Secondary | ICD-10-CM | POA: Diagnosis not present

## 2022-07-02 DIAGNOSIS — G4700 Insomnia, unspecified: Secondary | ICD-10-CM | POA: Diagnosis not present

## 2022-07-15 DIAGNOSIS — M353 Polymyalgia rheumatica: Secondary | ICD-10-CM | POA: Diagnosis not present

## 2022-07-15 DIAGNOSIS — Z79899 Other long term (current) drug therapy: Secondary | ICD-10-CM | POA: Diagnosis not present

## 2022-07-23 ENCOUNTER — Ambulatory Visit: Payer: Medicare HMO | Admitting: Internal Medicine

## 2022-07-23 DIAGNOSIS — M25511 Pain in right shoulder: Secondary | ICD-10-CM | POA: Diagnosis not present

## 2022-07-23 DIAGNOSIS — R5383 Other fatigue: Secondary | ICD-10-CM | POA: Diagnosis not present

## 2022-07-23 DIAGNOSIS — A770 Spotted fever due to Rickettsia rickettsii: Secondary | ICD-10-CM | POA: Diagnosis not present

## 2022-07-23 DIAGNOSIS — C61 Malignant neoplasm of prostate: Secondary | ICD-10-CM | POA: Diagnosis not present

## 2022-07-23 DIAGNOSIS — Z13820 Encounter for screening for osteoporosis: Secondary | ICD-10-CM | POA: Diagnosis not present

## 2022-07-23 DIAGNOSIS — M353 Polymyalgia rheumatica: Secondary | ICD-10-CM | POA: Diagnosis not present

## 2022-07-23 DIAGNOSIS — I10 Essential (primary) hypertension: Secondary | ICD-10-CM | POA: Diagnosis not present

## 2022-07-23 DIAGNOSIS — M25512 Pain in left shoulder: Secondary | ICD-10-CM | POA: Diagnosis not present

## 2022-07-23 DIAGNOSIS — G7000 Myasthenia gravis without (acute) exacerbation: Secondary | ICD-10-CM | POA: Diagnosis not present

## 2022-07-23 DIAGNOSIS — D649 Anemia, unspecified: Secondary | ICD-10-CM | POA: Diagnosis not present

## 2022-07-23 DIAGNOSIS — E785 Hyperlipidemia, unspecified: Secondary | ICD-10-CM | POA: Diagnosis not present

## 2022-07-23 DIAGNOSIS — E039 Hypothyroidism, unspecified: Secondary | ICD-10-CM | POA: Diagnosis not present

## 2022-07-28 DIAGNOSIS — I10 Essential (primary) hypertension: Secondary | ICD-10-CM | POA: Diagnosis not present

## 2022-07-28 DIAGNOSIS — M545 Low back pain, unspecified: Secondary | ICD-10-CM | POA: Diagnosis not present

## 2022-07-28 DIAGNOSIS — R52 Pain, unspecified: Secondary | ICD-10-CM | POA: Diagnosis not present

## 2022-07-28 DIAGNOSIS — Z299 Encounter for prophylactic measures, unspecified: Secondary | ICD-10-CM | POA: Diagnosis not present

## 2022-07-28 DIAGNOSIS — M549 Dorsalgia, unspecified: Secondary | ICD-10-CM | POA: Diagnosis not present

## 2022-08-01 DIAGNOSIS — G4700 Insomnia, unspecified: Secondary | ICD-10-CM | POA: Diagnosis not present

## 2022-08-01 DIAGNOSIS — I10 Essential (primary) hypertension: Secondary | ICD-10-CM | POA: Diagnosis not present

## 2022-08-13 DIAGNOSIS — C61 Malignant neoplasm of prostate: Secondary | ICD-10-CM | POA: Diagnosis not present

## 2022-08-13 DIAGNOSIS — N5201 Erectile dysfunction due to arterial insufficiency: Secondary | ICD-10-CM | POA: Diagnosis not present

## 2022-08-13 DIAGNOSIS — R300 Dysuria: Secondary | ICD-10-CM | POA: Diagnosis not present

## 2022-08-13 DIAGNOSIS — R109 Unspecified abdominal pain: Secondary | ICD-10-CM | POA: Diagnosis not present

## 2022-08-25 DIAGNOSIS — R102 Pelvic and perineal pain: Secondary | ICD-10-CM | POA: Diagnosis not present

## 2022-08-25 DIAGNOSIS — R109 Unspecified abdominal pain: Secondary | ICD-10-CM | POA: Diagnosis not present

## 2022-08-25 DIAGNOSIS — N200 Calculus of kidney: Secondary | ICD-10-CM | POA: Diagnosis not present

## 2022-08-25 DIAGNOSIS — C61 Malignant neoplasm of prostate: Secondary | ICD-10-CM | POA: Diagnosis not present

## 2022-08-25 DIAGNOSIS — K5730 Diverticulosis of large intestine without perforation or abscess without bleeding: Secondary | ICD-10-CM | POA: Diagnosis not present

## 2022-09-20 DIAGNOSIS — C61 Malignant neoplasm of prostate: Secondary | ICD-10-CM | POA: Diagnosis not present

## 2022-09-20 DIAGNOSIS — G720 Drug-induced myopathy: Secondary | ICD-10-CM | POA: Diagnosis not present

## 2022-09-20 DIAGNOSIS — F419 Anxiety disorder, unspecified: Secondary | ICD-10-CM | POA: Diagnosis not present

## 2022-09-20 DIAGNOSIS — Z299 Encounter for prophylactic measures, unspecified: Secondary | ICD-10-CM | POA: Diagnosis not present

## 2022-09-20 DIAGNOSIS — I10 Essential (primary) hypertension: Secondary | ICD-10-CM | POA: Diagnosis not present

## 2022-09-30 ENCOUNTER — Ambulatory Visit: Payer: Medicare HMO | Attending: Internal Medicine | Admitting: Internal Medicine

## 2022-09-30 ENCOUNTER — Encounter: Payer: Self-pay | Admitting: Internal Medicine

## 2022-09-30 VITALS — BP 100/50 | HR 70 | Ht 61.0 in | Wt 163.4 lb

## 2022-09-30 DIAGNOSIS — I10 Essential (primary) hypertension: Secondary | ICD-10-CM | POA: Diagnosis not present

## 2022-09-30 NOTE — Progress Notes (Signed)
Cardiology Office Note  Date: 09/30/2022   ID: Arthur Hart, Arthur Hart 1950-11-02, MRN [id number]  PCP:  Kirstie Peri, MD  Cardiologist:  Marjo Bicker, MD Electrophysiologist:  None    History of Present Illness: Arthur Hart is a 72 y.o. male known to have CAD status post angioplasty of LAD/LM spasm/residual 60% ostial diagonal nonobstructive disease in 2006, repeat LHC in 02/2022 showing 70% distal LAD/diagonal disease on medical management and normal LVEF, HTN, HLD, myasthenia gravis, polyarthritis rheumatica on chronic prednisone presented to cardiology clinic for follow up visit.  LHC from 03/01/2022 showed moderate to severe single-vessel CAD with up to 70% stenosis involving D2 and distal LAD. No intervention was performed as the stress test was deemed to be negative. Mild to moderate nonobstructive LCx and RCA disease was also noted. Overall doing great, denies any symptoms.  Currently on PCSK9 inhibitors for HLD management.  Has chest pains couple times a week but mostly at rest.  No chest pains with exertion.  No DOE, orthopnea, PND, leg swelling, dizziness, presyncope and syncope.  Past Medical History:  Diagnosis Date   Anemia    CAD (coronary artery disease)    followed by pcp  (last seen by cardiologist, dr degent 2009);  s/p cath 08-03-2004 PCI balloon angioplasty to LAD/ LM spasm/ residual 60% ostial diagonal nonobstructive   Essential hypertension, benign    followed by pcp   GERD (gastroesophageal reflux disease)    Heart murmur    History of COVID-19 01/2019   result in care everywhere;  per pt mild symptoms that resolved   History of kidney stones    History of pericarditis    1980s   History of rheumatic fever as a child    Hyperlipidemia    Hypothyroidism    followed by pcp   Lower urinary tract symptoms (LUTS)    Myasthenia gravis without exacerbation (HCC)    dx as teen  s/p thymectomy 1969 per pt all symptoms resolved with exception bilateral eye  drooping (does not see a neurologist)   Polyarthritis rheumatica [facility])    rheumatologist--- dr Octaviano Glow ([facility]);  pt taking daily prednisone   Prostate cancer [facility])    urologist--- dr pace;  dx 03/ 2022, Gleason 3+4, PSA 5.3   Wears contact lenses     Past Surgical History:  Procedure Laterality Date   CARDIAC CATHETERIZATION  07/30/2004   @[facility]  by dr Riley Kill;  chest pain and ekg changes;  moderately severe midLAD disease with evidence spasm of unknown etiology, normal LVF   COLONOSCOPY     last one 12/ 2021   CORONARY ANGIOPLASTY  08/03/2004   @[facility]  by dr Riley Kill;   PCI balloon angioplasty LAD,  LM spasm/  60% ostial diagonal nonobstructive   EXTRACORPOREAL SHOCK WAVE LITHOTRIPSY     1990s   LEFT HEART CATH AND CORONARY ANGIOGRAPHY N/A 03/01/2022   Procedure: LEFT HEART CATH AND CORONARY ANGIOGRAPHY;  Surgeon: Yvonne Kendall, MD;  [facility];  Service: Cardiovascular;  Laterality: N/A;   prostate biopsy  04/08/2020   RADIOACTIVE SEED IMPLANT N/A 10/28/2020   Procedure: RADIOACTIVE SEED IMPLANT/BRACHYTHERAPY IMPLANT;  Surgeon: Noel Christmas, MD;  Location: [facility];  Service: Urology;  Laterality: N/A;   SPACE OAR INSTILLATION N/A 10/28/2020   Procedure: SPACE OAR INSTILLATION;  Surgeon: Noel Christmas, MD;  Location: [facility];  Service: Urology;  Laterality: N/A;   TONSILLECTOMY     child  TOTAL THYMECTOMY  1969    Current Outpatient Medications  Medication Sig Dispense Refill   amLODipine (NORVASC) 5 MG tablet Take 1 tablet (5 mg total) by mouth daily. With 2.5 mg 90 tablet 3   Ascorbic Acid (VITAMIN C) 1000 MG tablet Take 1,000 mg by mouth daily.     aspirin EC 81 MG tablet Take 1 tablet (81 mg total) by mouth daily. Swallow whole.     levothyroxine (SYNTHROID) 200 MCG tablet Take 200 mcg by mouth daily.     lisinopril-hydrochlorothiazide (ZESTORETIC) 20-12.5 MG tablet Take 1 tablet by mouth daily.     Multiple  Vitamins-Minerals (CENTRUM SILVER 50+MEN PO) Take 1 capsule by mouth daily.     nitroGLYCERIN (NITROSTAT) 0.4 MG SL tablet Place 1 tablet (0.4 mg total) under the tongue every 5 (five) minutes as needed for chest pain. Do NOT use if you have taken Viagra (sildenafil) within the last 24 hours. 25 tablet PRN   predniSONE (DELTASONE) 5 MG tablet Take 5 mg by mouth 2 (two) times daily.     sildenafil (REVATIO) 20 MG tablet Take 20 mg by mouth daily as needed (ED).     No current facility-administered medications for this visit.   Allergies:  Beta adrenergic blockers, Carvedilol, and Crestor [rosuvastatin]   Social History: The patient  reports that he has never smoked. He quit smokeless tobacco use about 12 years ago.  His smokeless tobacco use included chew and snuff. He reports current alcohol use. He reports that he does not use drugs.   Family History: The patient's family history includes Leukemia in his maternal aunt; Lung cancer in his maternal uncle; Prostate cancer in his father.   ROS:  Please see the history of present illness. Otherwise, complete review of systems is positive for none.  All other systems are reviewed and negative.   Physical Exam: VS:  BP (!) 100/50   Pulse 70   Ht 5\' 1"  (1.549 m)   Wt 163 lb 6.4 oz (74.1 kg)   SpO2 96%   BMI 30.87 kg/m , BMI Body mass index is 30.87 kg/m.  Wt Readings from Last 3 Encounters:  09/30/22 163 lb 6.4 oz (74.1 kg)  03/19/22 168 lb 12.8 oz (76.6 kg)  03/01/22 172 lb (78 kg)    General: Patient appears comfortable at rest. HEENT: Conjunctiva and lids normal, oropharynx clear with moist mucosa. Neck: Supple, no elevated JVP or carotid bruits, no thyromegaly. Lungs: Clear to auscultation, nonlabored breathing at rest. Cardiac: Regular rate and rhythm, no S3 .  Systolic murmur present. Abdomen: Soft, nontender, no hepatomegaly, bowel sounds present, no guarding or rebound. Extremities: No pitting edema Skin: Warm and  dry. Musculoskeletal: No kyphosis. Neuropsychiatric: Alert and oriented x3, affect grossly appropriate.  ECG:  An ECG dated 12/04/2021 was personally reviewed today and demonstrated:  Normal sinus rhythm  Recent Labwork: No results found for requested labs within last 365 days.  No results found for: "CHOL", "TRIG", "HDL", "CHOLHDL", "VLDL", "LDLCALC", "LDLDIRECT"  Other Studies Reviewed Today: NM stress test and echocardiogram  Assessment and Plan: Patient is a 72 year old M known to have CAD status post angioplasty of LAD/LM spasm/residual 60% ostial diagonal nonobstructive disease, repeat LHC in 02/2022 showing 70% distal LAD/diagonal disease on medical management and normal LVEF  with normal LVEF, HTN, HLD, myasthenia gravis, polyarthritis rheumatica on chronic prednisone presented to cardiology clinic for follow-up visit  #CAD status post angioplasty of LAD/LM spasm/residual 60% ostial diagonal nonobstructive disease, repeat LHC in  02/2022 showing 70% distal LAD/diagonal disease on medical management and normal LVEF, currently angina free -Continue aspirin 81 mg once daily -Statin intolerance due to myasthenia gravis.  Currently on PCSK9 inhibitors. -Avoid beta-blockers and L-type calcium channel blockers (verapamil, diltiazem, nifedipine) in myasthenia gravis.  Avoid nitrates due to concurrent use of sildenafil. -Continue amlodipine 7.5 mg once daily (2.5 mg in the a.m. and 5 mg in the p.m.) -ER precautions for chest pain  #HLD, not at goal (LDL goal less than 70, LDL value 110s in October 1610) -Statin intolerance due to myasthenia gravis.  Currently on PCSK9 inhibitors.  He will obtain lipid panel with his PCP.  #HTN, partially controlled -Continue amlodipine as stated above, lisinopril-HCTZ 20-12.5 mg once daily.  # Mild mitral valve regurgitation on 2023 echo -Obtain 2D echocardiogram in 3 years  I have spent a total of 33 minutes with patient reviewing chart, EKGs, labs and  examining patient as well as establishing an assessment and plan that was discussed with the patient.  > 50% of time was spent in direct patient care.      Medication Adjustments/Labs and Tests Ordered: Current medicines are reviewed at length with the patient today.  Concerns regarding medicines are outlined above.   Tests Ordered: Orders Placed This Encounter  Procedures   EKG 12-Lead    Medication Changes: No orders of the defined types were placed in this encounter.   Disposition:  Follow up  1 year  Signed Circe Chilton Verne Spurr, MD, 09/30/2022 8:59 AM    [facility] at [facility] [address] [place], [place], [place] [address]

## 2022-09-30 NOTE — Patient Instructions (Signed)
Medication Instructions:  Your physician recommends that you continue on your current medications as directed. Please refer to the Current Medication list given to you today.   Labwork: None  Testing/Procedures: None  Follow-Up: Your physician recommends that you schedule a follow-up appointment in: 1 year. You will receive a reminder call in about months reminding you to schedule your appointment. If you don't receive this call, please contact our office.   Any Other Special Instructions Will Be Listed Below (If Applicable).  If you need a refill on your cardiac medications before your next appointment, please call your pharmacy.

## 2022-10-18 DIAGNOSIS — M353 Polymyalgia rheumatica: Secondary | ICD-10-CM | POA: Diagnosis not present

## 2022-10-18 DIAGNOSIS — Z79899 Other long term (current) drug therapy: Secondary | ICD-10-CM | POA: Diagnosis not present

## 2022-10-25 DIAGNOSIS — M353 Polymyalgia rheumatica: Secondary | ICD-10-CM | POA: Diagnosis not present

## 2022-10-25 DIAGNOSIS — G7000 Myasthenia gravis without (acute) exacerbation: Secondary | ICD-10-CM | POA: Diagnosis not present

## 2022-10-25 DIAGNOSIS — D649 Anemia, unspecified: Secondary | ICD-10-CM | POA: Diagnosis not present

## 2022-10-25 DIAGNOSIS — A770 Spotted fever due to Rickettsia rickettsii: Secondary | ICD-10-CM | POA: Diagnosis not present

## 2022-10-25 DIAGNOSIS — M25512 Pain in left shoulder: Secondary | ICD-10-CM | POA: Diagnosis not present

## 2022-10-25 DIAGNOSIS — C61 Malignant neoplasm of prostate: Secondary | ICD-10-CM | POA: Diagnosis not present

## 2022-10-25 DIAGNOSIS — M25511 Pain in right shoulder: Secondary | ICD-10-CM | POA: Diagnosis not present

## 2022-10-25 DIAGNOSIS — Z13820 Encounter for screening for osteoporosis: Secondary | ICD-10-CM | POA: Diagnosis not present

## 2022-10-25 DIAGNOSIS — E785 Hyperlipidemia, unspecified: Secondary | ICD-10-CM | POA: Diagnosis not present

## 2022-10-25 DIAGNOSIS — E669 Obesity, unspecified: Secondary | ICD-10-CM | POA: Diagnosis not present

## 2022-10-25 DIAGNOSIS — I10 Essential (primary) hypertension: Secondary | ICD-10-CM | POA: Diagnosis not present

## 2022-10-25 DIAGNOSIS — E039 Hypothyroidism, unspecified: Secondary | ICD-10-CM | POA: Diagnosis not present

## 2022-11-11 DIAGNOSIS — H524 Presbyopia: Secondary | ICD-10-CM | POA: Diagnosis not present

## 2022-11-17 DIAGNOSIS — Z79899 Other long term (current) drug therapy: Secondary | ICD-10-CM | POA: Diagnosis not present

## 2022-11-17 DIAGNOSIS — R5383 Other fatigue: Secondary | ICD-10-CM | POA: Diagnosis not present

## 2022-11-17 DIAGNOSIS — E039 Hypothyroidism, unspecified: Secondary | ICD-10-CM | POA: Diagnosis not present

## 2022-11-17 DIAGNOSIS — C61 Malignant neoplasm of prostate: Secondary | ICD-10-CM | POA: Diagnosis not present

## 2022-11-17 DIAGNOSIS — E7800 Pure hypercholesterolemia, unspecified: Secondary | ICD-10-CM | POA: Diagnosis not present

## 2022-11-18 DIAGNOSIS — Z0100 Encounter for examination of eyes and vision without abnormal findings: Secondary | ICD-10-CM | POA: Diagnosis not present

## 2023-01-20 DIAGNOSIS — Z79899 Other long term (current) drug therapy: Secondary | ICD-10-CM | POA: Diagnosis not present

## 2023-01-20 DIAGNOSIS — M353 Polymyalgia rheumatica: Secondary | ICD-10-CM | POA: Diagnosis not present

## 2023-02-24 DIAGNOSIS — M25512 Pain in left shoulder: Secondary | ICD-10-CM | POA: Diagnosis not present

## 2023-02-24 DIAGNOSIS — E785 Hyperlipidemia, unspecified: Secondary | ICD-10-CM | POA: Diagnosis not present

## 2023-02-24 DIAGNOSIS — E039 Hypothyroidism, unspecified: Secondary | ICD-10-CM | POA: Diagnosis not present

## 2023-02-24 DIAGNOSIS — M353 Polymyalgia rheumatica: Secondary | ICD-10-CM | POA: Diagnosis not present

## 2023-02-24 DIAGNOSIS — D649 Anemia, unspecified: Secondary | ICD-10-CM | POA: Diagnosis not present

## 2023-02-24 DIAGNOSIS — G7000 Myasthenia gravis without (acute) exacerbation: Secondary | ICD-10-CM | POA: Diagnosis not present

## 2023-02-24 DIAGNOSIS — M25511 Pain in right shoulder: Secondary | ICD-10-CM | POA: Diagnosis not present

## 2023-02-24 DIAGNOSIS — C61 Malignant neoplasm of prostate: Secondary | ICD-10-CM | POA: Diagnosis not present

## 2023-02-24 DIAGNOSIS — R5383 Other fatigue: Secondary | ICD-10-CM | POA: Diagnosis not present

## 2023-02-24 DIAGNOSIS — I10 Essential (primary) hypertension: Secondary | ICD-10-CM | POA: Diagnosis not present

## 2023-02-24 DIAGNOSIS — A770 Spotted fever due to Rickettsia rickettsii: Secondary | ICD-10-CM | POA: Diagnosis not present

## 2023-02-24 DIAGNOSIS — Z13820 Encounter for screening for osteoporosis: Secondary | ICD-10-CM | POA: Diagnosis not present

## 2023-04-05 ENCOUNTER — Other Ambulatory Visit (HOSPITAL_COMMUNITY): Payer: Self-pay | Admitting: Urology

## 2023-04-05 DIAGNOSIS — R9721 Rising PSA following treatment for malignant neoplasm of prostate: Secondary | ICD-10-CM

## 2023-04-12 ENCOUNTER — Encounter (HOSPITAL_COMMUNITY)
Admission: RE | Admit: 2023-04-12 | Discharge: 2023-04-12 | Disposition: A | Discharge status: Home / Self Care | Source: Ambulatory Visit | Attending: Urology | Admitting: Urology

## 2023-04-12 DIAGNOSIS — R9721 Rising PSA following treatment for malignant neoplasm of prostate: Secondary | ICD-10-CM | POA: Insufficient documentation

## 2023-04-12 DIAGNOSIS — C61 Malignant neoplasm of prostate: Secondary | ICD-10-CM | POA: Diagnosis not present

## 2023-04-12 MED ORDER — FLOTUFOLASTAT F 18 GALLIUM 296-5846 MBQ/ML IV SOLN
8.0000 | Freq: Once | INTRAVENOUS | Status: DC
  Filled 2023-04-12: qty 8

## 2023-05-04 DIAGNOSIS — M160 Bilateral primary osteoarthritis of hip: Secondary | ICD-10-CM | POA: Diagnosis not present

## 2023-05-04 DIAGNOSIS — M51369 Other intervertebral disc degeneration, lumbar region without mention of lumbar back pain or lower extremity pain: Secondary | ICD-10-CM | POA: Diagnosis not present

## 2023-05-04 DIAGNOSIS — M51379 Other intervertebral disc degeneration, lumbosacral region without mention of lumbar back pain or lower extremity pain: Secondary | ICD-10-CM | POA: Diagnosis not present

## 2023-05-04 DIAGNOSIS — M545 Low back pain, unspecified: Secondary | ICD-10-CM | POA: Diagnosis not present

## 2023-05-19 DIAGNOSIS — M353 Polymyalgia rheumatica: Secondary | ICD-10-CM | POA: Diagnosis not present

## 2023-05-19 DIAGNOSIS — Z79899 Other long term (current) drug therapy: Secondary | ICD-10-CM | POA: Diagnosis not present

## 2023-05-19 DIAGNOSIS — R5383 Other fatigue: Secondary | ICD-10-CM | POA: Diagnosis not present

## 2023-05-26 DIAGNOSIS — E785 Hyperlipidemia, unspecified: Secondary | ICD-10-CM | POA: Diagnosis not present

## 2023-05-26 DIAGNOSIS — A770 Spotted fever due to Rickettsia rickettsii: Secondary | ICD-10-CM | POA: Diagnosis not present

## 2023-05-26 DIAGNOSIS — G7000 Myasthenia gravis without (acute) exacerbation: Secondary | ICD-10-CM | POA: Diagnosis not present

## 2023-05-26 DIAGNOSIS — Z13820 Encounter for screening for osteoporosis: Secondary | ICD-10-CM | POA: Diagnosis not present

## 2023-05-26 DIAGNOSIS — M25511 Pain in right shoulder: Secondary | ICD-10-CM | POA: Diagnosis not present

## 2023-05-26 DIAGNOSIS — D649 Anemia, unspecified: Secondary | ICD-10-CM | POA: Diagnosis not present

## 2023-05-26 DIAGNOSIS — R5383 Other fatigue: Secondary | ICD-10-CM | POA: Diagnosis not present

## 2023-05-26 DIAGNOSIS — I10 Essential (primary) hypertension: Secondary | ICD-10-CM | POA: Diagnosis not present

## 2023-05-26 DIAGNOSIS — C61 Malignant neoplasm of prostate: Secondary | ICD-10-CM | POA: Diagnosis not present

## 2023-05-26 DIAGNOSIS — M353 Polymyalgia rheumatica: Secondary | ICD-10-CM | POA: Diagnosis not present

## 2023-05-26 DIAGNOSIS — E039 Hypothyroidism, unspecified: Secondary | ICD-10-CM | POA: Diagnosis not present

## 2023-05-26 DIAGNOSIS — M25512 Pain in left shoulder: Secondary | ICD-10-CM | POA: Diagnosis not present

## 2023-08-12 DIAGNOSIS — Z79899 Other long term (current) drug therapy: Secondary | ICD-10-CM | POA: Diagnosis not present

## 2023-08-12 DIAGNOSIS — M353 Polymyalgia rheumatica: Secondary | ICD-10-CM | POA: Diagnosis not present

## 2023-08-29 DIAGNOSIS — E875 Hyperkalemia: Secondary | ICD-10-CM | POA: Diagnosis not present

## 2023-08-29 DIAGNOSIS — E039 Hypothyroidism, unspecified: Secondary | ICD-10-CM | POA: Diagnosis not present

## 2023-08-29 DIAGNOSIS — M79642 Pain in left hand: Secondary | ICD-10-CM | POA: Diagnosis not present

## 2023-08-29 DIAGNOSIS — M79641 Pain in right hand: Secondary | ICD-10-CM | POA: Diagnosis not present

## 2023-08-29 DIAGNOSIS — D649 Anemia, unspecified: Secondary | ICD-10-CM | POA: Diagnosis not present

## 2023-08-29 DIAGNOSIS — E559 Vitamin D deficiency, unspecified: Secondary | ICD-10-CM | POA: Diagnosis not present

## 2023-11-03 DIAGNOSIS — Z79899 Other long term (current) drug therapy: Secondary | ICD-10-CM | POA: Diagnosis not present

## 2023-11-03 DIAGNOSIS — E039 Hypothyroidism, unspecified: Secondary | ICD-10-CM | POA: Diagnosis not present

## 2023-11-03 DIAGNOSIS — Z125 Encounter for screening for malignant neoplasm of prostate: Secondary | ICD-10-CM | POA: Diagnosis not present

## 2023-11-07 DIAGNOSIS — R9721 Rising PSA following treatment for malignant neoplasm of prostate: Secondary | ICD-10-CM | POA: Diagnosis not present

## 2023-11-14 DIAGNOSIS — R9721 Rising PSA following treatment for malignant neoplasm of prostate: Secondary | ICD-10-CM | POA: Diagnosis not present

## 2023-11-14 DIAGNOSIS — N50819 Testicular pain, unspecified: Secondary | ICD-10-CM | POA: Diagnosis not present

## 2023-11-14 DIAGNOSIS — C61 Malignant neoplasm of prostate: Secondary | ICD-10-CM | POA: Diagnosis not present

## 2023-11-18 DIAGNOSIS — H524 Presbyopia: Secondary | ICD-10-CM | POA: Diagnosis not present

## 2023-11-18 DIAGNOSIS — Z0100 Encounter for examination of eyes and vision without abnormal findings: Secondary | ICD-10-CM | POA: Diagnosis not present

## 2023-11-29 DIAGNOSIS — D649 Anemia, unspecified: Secondary | ICD-10-CM | POA: Diagnosis not present

## 2023-11-29 DIAGNOSIS — M130 Polyarthritis, unspecified: Secondary | ICD-10-CM | POA: Diagnosis not present

## 2023-11-29 DIAGNOSIS — E875 Hyperkalemia: Secondary | ICD-10-CM | POA: Diagnosis not present

## 2023-11-29 DIAGNOSIS — E039 Hypothyroidism, unspecified: Secondary | ICD-10-CM | POA: Diagnosis not present

## 2023-11-29 DIAGNOSIS — E559 Vitamin D deficiency, unspecified: Secondary | ICD-10-CM | POA: Diagnosis not present

## 2023-11-29 DIAGNOSIS — Z79899 Other long term (current) drug therapy: Secondary | ICD-10-CM | POA: Diagnosis not present

## 2024-01-18 DIAGNOSIS — R9721 Rising PSA following treatment for malignant neoplasm of prostate: Secondary | ICD-10-CM | POA: Diagnosis not present

## 2024-01-18 DIAGNOSIS — C61 Malignant neoplasm of prostate: Secondary | ICD-10-CM | POA: Diagnosis not present

## 2024-02-01 NOTE — Progress Notes (Signed)
 GU Location of Tumor / Histology: Prostate Ca (biochemical recurrence)  If Prostate Cancer, Gleason Score is (4 + 4) and PSA is (2.4 on 11/07/2023)  PSA 2.3 on 11/03/2023  Arthur Hart presented as referral from Dr. Maryellen D. Pace [facility]) elevated PSA.  Biopsies     04/12/2023 Dr. Maryellen D. Pace NM PET (PSMA) Skull to Mid Thigh CLINICAL DATA:  Prostate cancer. Prior brachytherapy. Biochemical recurrence.   IMPRESSION: 1. No evidence of prostate cancer recurrence in the prostate gland. Brachytherapy seeds noted. 2. No evidence of metastatic adenopathy in the pelvis or periaortic retroperitoneum. 3. No evidence of visceral metastasis or skeletal metastasis.   Past/Anticipated interventions by urology, if any:  Dr. Maryellen D. Pace    Past/Anticipated interventions by medical oncology, if any: NA  Weight changes, if any: No  IPSS:  24 SHIM:  8  Bowel/Bladder complaints, if any:  No  Nausea/Vomiting, if any:  No  Pain issues, if any:  0 Generalized pain with age per patient.  SAFETY ISSUES: Prior radiation? Yes, Brachytherapy 09/11/2020 Pacemaker/ICD? No Possible current pregnancy? Male Is the patient on methotrexate? No  Current Complaints / other details: None   30 minutes spent total, including time for meaningful use questions, reviewing medication, as well as spent in face-to-face time in nurse evaluation with the patient.

## 2024-02-06 ENCOUNTER — Ambulatory Visit
Admission: RE | Admit: 2024-02-06 | Discharge: 2024-02-06 | Disposition: A | Discharge status: Home / Self Care | Source: Ambulatory Visit | Attending: Radiation Oncology | Admitting: Radiation Oncology

## 2024-02-06 ENCOUNTER — Encounter: Payer: Self-pay | Admitting: Radiation Oncology

## 2024-02-06 VITALS — BP 177/70 | HR 65 | Temp 97.3°F | Resp 18 | Ht 64.0 in | Wt 165.7 lb

## 2024-02-06 VITALS — BP 147/70 | HR 70 | Temp 97.3°F | Resp 18 | Wt 165.4 lb

## 2024-02-06 DIAGNOSIS — C61 Malignant neoplasm of prostate: Secondary | ICD-10-CM

## 2024-02-06 NOTE — Progress Notes (Signed)
 Introduced myself to the patient as the prostate nurse navigator. He is here to discuss his radiation treatment options, and will proceed with a PSMA PET with plans of 5 fx's to prostate pending results. I provided patient with some written education and my direct contact information.  Patient knows to reach out with any questions or barriers that may arise.

## 2024-02-06 NOTE — Progress Notes (Signed)
 " Radiation Oncology         [phone] ________________________________  Outpatient Consultation  Name: Arthur Hart MRN: [id number]  Date of Service: 02/06/2024 DOB: Mar 24, 1950  RR:Dyjy, Eligio, MD  Elisabeth Valli BIRCH, MD   REFERRING PHYSICIAN: Elisabeth Valli BIRCH, MD  DIAGNOSIS: 74 y/o man with biochemically recurrent prostate cancer s/p brachytherapy in 10/2020 for low-volume Gleason 3+4 adenocarcinoma the prostate with a pretreatment PSA of 5.3.  Now Gleason 4+4 with PSA of 2.4    ICD-10-CM   1. Biochemically recurrent malignant neoplasm of prostate (HCC)  C61    R97.21     2. Malignant neoplasm of prostate (HCC)  C61       HISTORY OF PRESENT ILLNESS: Arthur Hart is a 74 y.o. male seen at the request of Dr. Elisabeth for biochemically recurrent prostate cancer.  He has had a rising PSA s/p brachytherapy in September 2022 for low-volume Gleason 3+4 prostate cancer with a pretreatment PSA of 5.3.  His PSA nadired at 0.47 on 07/31/2021 and remained stable on 02/05/2022 but began rising since that time at 0.92 in June 2024, 1.07 in November 2024 and 1.85 on 03/25/2023.  A PSMA PET scan was performed on 04/12/2023 for disease restaging and was without evidence of cancer recurrence in or outside of the prostate.  His most recent PSA had increased to 2.4 on 11/03/2023.  Therefore, he proceeded to repeat TRUSPBx of the prostate on 01/09/2024 which confirmed Gleason 4+4 adenocarcinoma in 1 of 12 cores involving the left mid lateral.  He has been kindly referred back to us  today to discuss the potential role for salvage radiotherapy.  PREVIOUS RADIATION THERAPY: Yes  10/28/20:  Insertion of radioactive I-125 seeds into the prostate gland; 145 Gy, definitive therapy with placement of SpaceOAR gel.   PAST MEDICAL HISTORY:  Past Medical History:  Diagnosis Date   Anemia    CAD (coronary artery disease)    followed by pcp  (last seen by cardiologist, dr degent 2009);  s/p cath 08-03-2004 PCI balloon  angioplasty to LAD/ LM spasm/ residual 60% ostial diagonal nonobstructive   Essential hypertension, benign    followed by pcp   GERD (gastroesophageal reflux disease)    Heart murmur    History of COVID-19 01/2019   result in care everywhere;  per pt mild symptoms that resolved   History of kidney stones    History of pericarditis    1980s   History of rheumatic fever as a child    Hyperlipidemia    Hypothyroidism    followed by pcp   Lower urinary tract symptoms (LUTS)    Myasthenia gravis without exacerbation (HCC)    dx as teen  s/p thymectomy 1969 per pt all symptoms resolved with exception bilateral eye drooping (does not see a neurologist)   Polyarthritis rheumatica [facility])    rheumatologist--- dr fae ([facility]);  pt taking daily prednisone   Prostate cancer [facility])    urologist--- dr pace;  dx 03/ 2022, Gleason 3+4, PSA 5.3   Wears contact lenses       PAST SURGICAL HISTORY: Past Surgical History:  Procedure Laterality Date   CARDIAC CATHETERIZATION  07/30/2004   @[facility]  by dr morris;  chest pain and ekg changes;  moderately severe midLAD disease with evidence spasm of unknown etiology, normal LVF   COLONOSCOPY     last one 12/ 2021   CORONARY ANGIOPLASTY  08/03/2004   @[facility]  by dr morris;   PCI balloon angioplasty LAD,  LM spasm/  60% ostial diagonal nonobstructive   EXTRACORPOREAL SHOCK WAVE LITHOTRIPSY     1990s   LEFT HEART CATH AND CORONARY ANGIOGRAPHY N/A 03/01/2022   Procedure: LEFT HEART CATH AND CORONARY ANGIOGRAPHY;  Surgeon: Mady Bruckner, MD;  Location: [facility];  Service: Cardiovascular;  Laterality: N/A;   prostate biopsy  04/08/2020   RADIOACTIVE SEED IMPLANT N/A 10/28/2020   Procedure: RADIOACTIVE SEED IMPLANT/BRACHYTHERAPY IMPLANT;  Surgeon: Elisabeth Valli BIRCH, MD;  Location: [facility];  Service: Urology;  Laterality: N/A;   SPACE OAR INSTILLATION N/A 10/28/2020   Procedure: SPACE OAR INSTILLATION;  Surgeon: Elisabeth Valli BIRCH,  MD;  Location: [facility];  Service: Urology;  Laterality: N/A;   TONSILLECTOMY     child   TOTAL THYMECTOMY  1969    FAMILY HISTORY:  Family History  Problem Relation Age of Onset   Prostate cancer Father    Leukemia Maternal Aunt    Lung cancer Maternal Uncle    Breast cancer Neg Hx    Colon cancer Neg Hx    Pancreatic cancer Neg Hx     SOCIAL HISTORY:  Social History   Socioeconomic History   Marital status: Married    Spouse name: Not on file   Number of children: 2   Years of education: Not on file   Highest education level: Not on file  Occupational History   Not on file  Tobacco Use   Smoking status: Never   Smokeless tobacco: Former    Types: Chew, Snuff    Quit date: 2012   Tobacco comments:    Had Dip/ chew for 10 yrs   Vaping Use   Vaping status: Never Used  Substance and Sexual Activity   Alcohol  use: Yes    Comment: occasional   Drug use: Never   Sexual activity: Yes  Other Topics Concern   Not on file  Social History Narrative   Not on file   Social Drivers of Health   Tobacco Use: Medium Risk (02/06/2024)   Patient History    Smoking Tobacco Use: Never    Smokeless Tobacco Use: Former    Passive Exposure: Not on Actuary Strain: Not on file  Food Insecurity: Food Insecurity Present (02/06/2024)   Epic    Worried About Programme Researcher, Broadcasting/film/video in the Last Year: Sometimes true    Ran Out of Food in the Last Year: Never true  Transportation Needs: No Transportation Needs (02/06/2024)   Epic    Lack of Transportation (Medical): No    Lack of Transportation (Non-Medical): No  Physical Activity: Not on file  Stress: Not on file  Social Connections: Not on file  Intimate Partner Violence: Not At Risk (02/06/2024)   Epic    Fear of Current or Ex-Partner: No    Emotionally Abused: No    Physically Abused: No    Sexually Abused: No  Depression (PHQ2-9): Low Risk (02/06/2024)   Depression (PHQ2-9)    PHQ-2 Score: 2   Alcohol  Screen: Low Risk (02/06/2024)   Alcohol  Screen    Last Alcohol  Screening Score (AUDIT): 1  Housing: Low Risk (02/06/2024)   Epic    Unable to Pay for Housing in the Last Year: No    Number of Times Moved in the Last Year: 0    Homeless in the Last Year: No  Utilities: Not At Risk (02/06/2024)   Epic    Threatened with loss of utilities: No  Health Literacy: Not on file    ALLERGIES: Beta adrenergic blockers, Carvedilol , and Crestor  [rosuvastatin ]  MEDICATIONS:  Current Outpatient Medications  Medication Sig Dispense Refill   ALPRAZolam (XANAX) 0.5 MG tablet Take 0.5 mg by mouth daily as needed.     levothyroxine  (SYNTHROID ) 150 MCG tablet Take 150 mcg by mouth every morning.     predniSONE (DELTASONE) 5 MG tablet Take 5 mg by mouth 2 (two) times daily.     silver sulfADIAZINE (SSD) 1 % cream Apply 1 Application topically as needed.     zolpidem (AMBIEN) 5 MG tablet Take 5 mg by mouth at bedtime as needed.     amLODipine  (NORVASC ) 5 MG tablet Take 1 tablet (5 mg total) by mouth daily. With 2.5 mg 90 tablet 3   Ascorbic Acid (VITAMIN C) 1000 MG tablet Take 1,000 mg by mouth daily.     aspirin  EC 81 MG tablet Take 1 tablet (81 mg total) by mouth daily. Swallow whole.     lisinopril-hydrochlorothiazide (ZESTORETIC) 20-12.5 MG tablet Take 1 tablet by mouth daily.     Multiple Vitamins-Minerals (CENTRUM SILVER 50+MEN PO) Take 1 capsule by mouth daily.     sildenafil (REVATIO) 20 MG tablet Take 20 mg by mouth daily as needed (ED).     No current facility-administered medications for this encounter.    REVIEW OF SYSTEMS:  On review of systems, the patient reports that he is doing well overall.  He denies any chest pain, shortness of breath, cough, fevers, chills, night sweats, unintended weight changes.  He denies any bowel or bladder disturbances, and denies abdominal pain, nausea or vomiting.  He denies any new musculoskeletal or joint aches or pains.  His IPSS score is 24, indicating  severe urinary symptoms with frequency, urgency, weak flow of stream, intermittency and nocturia x 4.  His SHIM score was 8, indicating moderate to severe erectile dysfunction.  A complete review of systems is obtained and is otherwise negative.    PHYSICAL EXAM:  Wt Readings from Last 3 Encounters:  02/06/24 165 lb 11.2 oz (75.2 kg)  02/06/24 165 lb 6.4 oz (75 kg)  09/30/22 163 lb 6.4 oz (74.1 kg)   Temp Readings from Last 3 Encounters:  02/06/24 (!) 97.3 F (36.3 C) (Oral)  02/06/24 (!) 97.3 F (36.3 C) (Temporal)  03/01/22 98.2 F (36.8 C) (Oral)   BP Readings from Last 3 Encounters:  02/06/24 (!) 177/70  02/06/24 (!) 147/70  09/30/22 (!) 100/50   Pulse Readings from Last 3 Encounters:  02/06/24 65  02/06/24 70  09/30/22 70   Pain Assessment Pain Score: 0-No pain/10  In general this is a well appearing Caucasian man in no acute distress.  He's alert and oriented x4 and appropriate throughout the examination. Cardiopulmonary assessment is negative for acute distress and he exhibits normal effort.   KPS = 100  100 - Normal; no complaints; no evidence of disease. 90   - Able to carry on normal activity; minor signs or symptoms of disease. 80   - Normal activity with effort; some signs or symptoms of disease. 63   - Cares for self; unable to carry on normal activity or to do active work. 60   - Requires occasional assistance, but is able to care for most of his personal needs. 50   - Requires considerable assistance and frequent medical care. 40   - Disabled; requires special care and assistance. 30   - Severely disabled; hospital admission is indicated  although death not imminent. 20   - Very sick; hospital admission necessary; active supportive treatment necessary. 10   - Moribund; fatal processes progressing rapidly. 0     - Dead  Karnofsky DA, Abelmann WH, Craver LS and Burchenal [facility] [phone]) The use of the nitrogen mustards in the palliative treatment of carcinoma: with  particular reference to bronchogenic carcinoma Cancer 1 634-56  LABORATORY DATA:  Lab Results  Component Value Date   WBC 14.8 (H) 10/24/2020   HGB 12.3 (L) 10/24/2020   HCT 37.3 (L) 10/24/2020   MCV 100.8 (H) 10/24/2020   PLT 239 10/24/2020   Lab Results  Component Value Date   NA 138 10/24/2020   K 4.3 10/24/2020   CL 106 10/24/2020   CO2 25 10/24/2020   Lab Results  Component Value Date   ALT 23 10/24/2020   AST 29 10/24/2020   ALKPHOS 56 10/24/2020   BILITOT 1.0 10/24/2020     RADIOGRAPHY: No results found.    IMPRESSION/PLAN: 1. 74 y.o. man with biochemically recurrent prostate cancer s/p brachytherapy in 10/2020 for low-volume Gleason 3+4 adenocarcinoma the prostate with a pretreatment PSA of 5.3.  Now Gleason 4+4 with PSA of 2.4  Today we reviewed the findings and workup thus far.  We discussed the natural history of prostate cancer.  We reviewed the the implications of a rising posttreatment PSA which now meets the criteria for biochemical recurrence at 2.4.  He also has biopsy-proven recurrence in the prostate.  We reviewed some of the evidence suggesting an advantage for patients who undergo salvage radiotherapy in this setting in terms of disease control and overall survival. We discussed focused radiation treatment directed to the biopsy-proven recurrence with regard to the logistics and delivery of stereotactic body radiotherapy (SBRT).  However, we would recommend repeating a PSMA PET scan for disease restaging to confirm the extent of disease recurrence prior to initiating treatment.  He and his wife were encouraged to ask questions that were answered to their stated satisfaction.  At the conclusion of our conversation, the patient is in agreement to proceed with repeat PSMA PET imaging and pending there is no evidence of widespread metastatic disease, he would like to proceed with the recommended 5 fraction course of SBRT to the prostate and any additional sites of  oligo metastatic disease that might be identified.  He appears to have a good understanding of his disease and our treatment recommendations which are of curative intent.  He has freely signed written consent to proceed today in the office and a copy of this document will be placed in his medical record.  We will coordinate for the repeat PSMA PET scan, first available and I will plan to call him with those results and move forward with treatment planning accordingly at that time.  We will share our discussion with Dr. Elisabeth and they know they are welcome to call at anytime with any questions or concerns in the interim.   We personally spent 70 minutes in this encounter including chart review, reviewing radiological studies, meeting face-to-face with the patient, entering orders and completing documentation.    Sabra MICAEL Rusk, PA-C    Donnice Barge, MD  [facility]  Radiation Oncology Direct Dial: [phone]  Fax: [phone] [email or web address]  Skype  LinkedIn          "

## 2024-02-07 NOTE — Progress Notes (Signed)
 Patient is now scheduled for PSMA PET on 02/14/24, followed by CT Simulation on 02/15/24.

## 2024-02-14 ENCOUNTER — Encounter (HOSPITAL_COMMUNITY)
Admission: RE | Admit: 2024-02-14 | Discharge: 2024-02-14 | Disposition: A | Discharge status: Home / Self Care | Source: Ambulatory Visit | Attending: Urology | Admitting: Urology

## 2024-02-14 DIAGNOSIS — C61 Malignant neoplasm of prostate: Secondary | ICD-10-CM | POA: Insufficient documentation

## 2024-02-14 DIAGNOSIS — R9721 Rising PSA following treatment for malignant neoplasm of prostate: Secondary | ICD-10-CM | POA: Insufficient documentation

## 2024-02-14 MED ORDER — FLOTUFOLASTAT F 18 GALLIUM 296-5846 MBQ/ML IV SOLN
8.2500 | Freq: Once | INTRAVENOUS | Status: AC
  Administered 2024-02-14: 8.25 via INTRAVENOUS

## 2024-02-15 ENCOUNTER — Ambulatory Visit
Admission: RE | Admit: 2024-02-15 | Discharge: 2024-02-15 | Disposition: A | Discharge status: Home / Self Care | Source: Ambulatory Visit | Attending: Radiation Oncology | Admitting: Radiation Oncology

## 2024-02-15 DIAGNOSIS — C61 Malignant neoplasm of prostate: Secondary | ICD-10-CM | POA: Insufficient documentation

## 2024-02-15 DIAGNOSIS — R9721 Rising PSA following treatment for malignant neoplasm of prostate: Secondary | ICD-10-CM | POA: Diagnosis not present

## 2024-02-15 NOTE — Progress Notes (Signed)
" °  Radiation Oncology         [phone] ________________________________  Name: Arthur Hart MRN: [id number]  Date: 02/15/2024  DOB: [birth year]  STEREOTACTIC BODY RADIOTHERAPY SIMULATION AND TREATMENT PLANNING NOTE    ICD-10-CM   1. Biochemically recurrent malignant neoplasm of prostate [facility])  C61    R97.21       DIAGNOSIS:  74 y/o man with biochemically recurrent prostate cancer s/p brachytherapy in 10/2020 for low-volume Gleason 3+4 adenocarcinoma of the prostate with a pretreatment PSA of 5.3.  Now Gleason 4+4 with PSA of 2.4   NARRATIVE:  The patient was brought to the CT Simulation planning suite.  Identity was confirmed.  All relevant records and images related to the planned course of therapy were reviewed.  The patient freely provided informed written consent to proceed with treatment after reviewing the details related to the planned course of therapy. The consent form was witnessed and verified by the simulation staff.  Then, the patient was set-up in a stable reproducible  supine position for radiation therapy.  A BodyFix immobilization pillow was fabricated for reproducible positioning.  Surface markings were placed.  The CT images were loaded into the planning software.  The gross target volumes (GTV) and planning target volumes (PTV) were delinieated, and avoidance structures were contoured.  Treatment planning then occurred.  The radiation prescription was entered and confirmed.  A total of two complex treatment devices were fabricated in the form of the BodyFix immobilization pillow and a neck accuform cushion.  I have requested : 3D Simulation  I have requested a DVH of the following structures: targets and all normal structures near the target including bladder, urethra, rectum and femoral heads as noted on the radiation plan to maintain doses in adherence with established limits  SPECIAL TREATMENT PROCEDURE:  The planned course of therapy using radiation constitutes a special  treatment procedure. Special care is required in the management of this patient for the following reasons. High dose per fraction requiring special monitoring for increased toxicities of treatment including daily imaging..  The special nature of the planned course of radiotherapy will require increased physician supervision and oversight to ensure patient's safety with optimal treatment outcomes.    This requires extended time and effort.    PLAN:  The patient will receive 40 Gy in 5 fractions of 8 Gy each.  ________________________________  Donnice LABOR. Patrcia, M.D.  "

## 2024-02-16 NOTE — Progress Notes (Signed)
 RN notified patient of PSMA PET results.  A copy will be forwarded to patient's PCP for follow up regarding the multiple subsolid (ground-glass) nodules in the right upper lobe measuringup to 16 mm; recommend CT at 3-6 months, then subsequent management based on the most suspicious nodule(s) per Fleischner Society recommendations (2017).  Patient verbalized understanding.    Plan of care in progress.

## 2024-02-22 ENCOUNTER — Telehealth: Payer: Self-pay | Admitting: Radiation Oncology

## 2024-02-22 DIAGNOSIS — C61 Malignant neoplasm of prostate: Secondary | ICD-10-CM | POA: Diagnosis not present

## 2024-02-22 NOTE — Telephone Encounter (Signed)
 1/21 Patient called with concerns about bad weather coming in a starting his treatments on 1/26. Called Support RTT, transfer call as requested.

## 2024-02-27 ENCOUNTER — Ambulatory Visit: Admitting: Radiation Oncology

## 2024-02-29 ENCOUNTER — Other Ambulatory Visit: Payer: Self-pay

## 2024-02-29 ENCOUNTER — Ambulatory Visit
Admission: RE | Admit: 2024-02-29 | Discharge: 2024-02-29 | Disposition: A | Source: Ambulatory Visit | Attending: Radiation Oncology | Admitting: Radiation Oncology

## 2024-02-29 DIAGNOSIS — R9721 Rising PSA following treatment for malignant neoplasm of prostate: Secondary | ICD-10-CM

## 2024-02-29 LAB — RAD ONC ARIA SESSION SUMMARY
Course Elapsed Days: 0
Plan Fractions Treated to Date: 1
Plan Prescribed Dose Per Fraction: 8 Gy
Plan Total Fractions Prescribed: 5
Plan Total Prescribed Dose: 40 Gy
Reference Point Dosage Given to Date: 8 Gy
Reference Point Session Dosage Given: 8 Gy
Session Number: 1

## 2024-03-02 ENCOUNTER — Ambulatory Visit
Admission: RE | Admit: 2024-03-02 | Discharge: 2024-03-02 | Disposition: A | Source: Ambulatory Visit | Attending: Radiation Oncology | Admitting: Radiation Oncology

## 2024-03-02 ENCOUNTER — Other Ambulatory Visit: Payer: Self-pay

## 2024-03-02 DIAGNOSIS — C61 Malignant neoplasm of prostate: Secondary | ICD-10-CM

## 2024-03-02 LAB — RAD ONC ARIA SESSION SUMMARY
Course Elapsed Days: 2
Plan Fractions Treated to Date: 2
Plan Prescribed Dose Per Fraction: 8 Gy
Plan Total Fractions Prescribed: 5
Plan Total Prescribed Dose: 40 Gy
Reference Point Dosage Given to Date: 16 Gy
Reference Point Session Dosage Given: 8 Gy
Session Number: 2

## 2024-03-06 ENCOUNTER — Other Ambulatory Visit: Payer: Self-pay

## 2024-03-06 ENCOUNTER — Ambulatory Visit
Admission: RE | Admit: 2024-03-06 | Discharge: 2024-03-06 | Disposition: A | Source: Ambulatory Visit | Attending: Radiation Oncology | Admitting: Radiation Oncology

## 2024-03-06 DIAGNOSIS — R9721 Rising PSA following treatment for malignant neoplasm of prostate: Secondary | ICD-10-CM

## 2024-03-06 LAB — RAD ONC ARIA SESSION SUMMARY
Course Elapsed Days: 6
Plan Fractions Treated to Date: 3
Plan Prescribed Dose Per Fraction: 8 Gy
Plan Total Fractions Prescribed: 5
Plan Total Prescribed Dose: 40 Gy
Reference Point Dosage Given to Date: 24 Gy
Reference Point Session Dosage Given: 8 Gy
Session Number: 3

## 2024-03-08 ENCOUNTER — Ambulatory Visit
Admission: RE | Admit: 2024-03-08 | Discharge: 2024-03-08 | Disposition: A | Source: Ambulatory Visit | Attending: Radiation Oncology | Admitting: Radiation Oncology

## 2024-03-08 ENCOUNTER — Other Ambulatory Visit: Payer: Self-pay

## 2024-03-08 ENCOUNTER — Ambulatory Visit
Admission: RE | Admit: 2024-03-08 | Discharge: 2024-03-08 | Disposition: A | Source: Ambulatory Visit | Attending: Radiation Oncology

## 2024-03-08 LAB — RAD ONC ARIA SESSION SUMMARY
Course Elapsed Days: 8
Plan Fractions Treated to Date: 4
Plan Prescribed Dose Per Fraction: 8 Gy
Plan Total Fractions Prescribed: 5
Plan Total Prescribed Dose: 40 Gy
Reference Point Dosage Given to Date: 32 Gy
Reference Point Session Dosage Given: 8 Gy
Session Number: 4

## 2024-03-12 ENCOUNTER — Ambulatory Visit: Admitting: Radiation Oncology
